# Patient Record
Sex: Female | Born: 1955 | Race: White | Hispanic: No | Marital: Married | Smoking: Never smoker
Health system: Southern US, Community
[De-identification: ages and names within clinical notes are randomized; demographics above are authoritative.]

## PROBLEM LIST (undated history)

## (undated) DIAGNOSIS — K219 Gastro-esophageal reflux disease without esophagitis: Secondary | ICD-10-CM

## (undated) DIAGNOSIS — M1990 Unspecified osteoarthritis, unspecified site: Secondary | ICD-10-CM

## (undated) DIAGNOSIS — I10 Essential (primary) hypertension: Secondary | ICD-10-CM

## (undated) DIAGNOSIS — E785 Hyperlipidemia, unspecified: Secondary | ICD-10-CM

## (undated) DIAGNOSIS — D329 Benign neoplasm of meninges, unspecified: Principal | ICD-10-CM

## (undated) HISTORY — DX: Benign neoplasm of meninges, unspecified: D32.9

## (undated) HISTORY — PX: OTHER SURGICAL HISTORY: SHX169

## (undated) HISTORY — DX: Essential (primary) hypertension: I10

---

## 1976-09-12 HISTORY — PX: WISDOM TOOTH EXTRACTION: SHX21

## 1990-09-12 HISTORY — PX: TUBAL LIGATION: SHX77

## 1991-09-13 HISTORY — PX: VAGINAL HYSTERECTOMY: SHX2639

## 2002-05-17 ENCOUNTER — Encounter: Payer: Self-pay | Admitting: Family Medicine

## 2002-05-17 ENCOUNTER — Encounter: Admission: RE | Admit: 2002-05-17 | Discharge: 2002-05-17 | Payer: Self-pay | Admitting: Family Medicine

## 2003-08-08 ENCOUNTER — Encounter: Admission: RE | Admit: 2003-08-08 | Discharge: 2003-08-08 | Payer: Self-pay | Admitting: Family Medicine

## 2004-10-15 ENCOUNTER — Encounter: Admission: RE | Admit: 2004-10-15 | Discharge: 2004-10-15 | Payer: Self-pay | Admitting: Family Medicine

## 2006-02-08 ENCOUNTER — Encounter: Admission: RE | Admit: 2006-02-08 | Discharge: 2006-02-08 | Payer: Self-pay | Admitting: Family Medicine

## 2007-08-22 ENCOUNTER — Encounter: Admission: RE | Admit: 2007-08-22 | Discharge: 2007-08-22 | Payer: Self-pay | Admitting: Family Medicine

## 2007-08-27 ENCOUNTER — Encounter: Admission: RE | Admit: 2007-08-27 | Discharge: 2007-08-27 | Payer: Self-pay | Admitting: Family Medicine

## 2008-10-03 ENCOUNTER — Ambulatory Visit (HOSPITAL_COMMUNITY): Admission: RE | Admit: 2008-10-03 | Discharge: 2008-10-03 | Payer: Self-pay | Admitting: Family Medicine

## 2008-10-06 LAB — HM DEXA SCAN

## 2009-02-16 ENCOUNTER — Ambulatory Visit: Payer: Self-pay | Admitting: Gastroenterology

## 2009-03-02 ENCOUNTER — Ambulatory Visit: Payer: Self-pay | Admitting: Gastroenterology

## 2009-03-02 LAB — HM COLONOSCOPY: HM Colonoscopy: NORMAL

## 2009-12-29 ENCOUNTER — Ambulatory Visit (HOSPITAL_COMMUNITY): Admission: RE | Admit: 2009-12-29 | Discharge: 2009-12-29 | Payer: Self-pay | Admitting: Family Medicine

## 2010-05-25 LAB — HM PAP SMEAR

## 2010-09-12 DIAGNOSIS — I10 Essential (primary) hypertension: Secondary | ICD-10-CM

## 2010-09-12 HISTORY — DX: Essential (primary) hypertension: I10

## 2011-02-08 ENCOUNTER — Other Ambulatory Visit (HOSPITAL_COMMUNITY): Payer: Self-pay | Admitting: Family Medicine

## 2011-02-08 DIAGNOSIS — Z1231 Encounter for screening mammogram for malignant neoplasm of breast: Secondary | ICD-10-CM

## 2011-02-24 ENCOUNTER — Ambulatory Visit (HOSPITAL_COMMUNITY)
Admission: RE | Admit: 2011-02-24 | Discharge: 2011-02-24 | Disposition: A | Discharge status: Home / Self Care | Payer: 59 | Source: Ambulatory Visit | Attending: Family Medicine | Admitting: Family Medicine

## 2011-02-24 DIAGNOSIS — Z1231 Encounter for screening mammogram for malignant neoplasm of breast: Secondary | ICD-10-CM | POA: Insufficient documentation

## 2011-04-14 ENCOUNTER — Ambulatory Visit (INDEPENDENT_AMBULATORY_CARE_PROVIDER_SITE_OTHER): Payer: 59 | Admitting: Sports Medicine

## 2011-04-14 VITALS — BP 154/97 | Ht 61.0 in | Wt 135.0 lb

## 2011-04-14 DIAGNOSIS — M79609 Pain in unspecified limb: Secondary | ICD-10-CM

## 2011-04-14 DIAGNOSIS — M2170 Unequal limb length (acquired), unspecified site: Secondary | ICD-10-CM

## 2011-04-14 DIAGNOSIS — M722 Plantar fascial fibromatosis: Secondary | ICD-10-CM | POA: Insufficient documentation

## 2011-04-14 DIAGNOSIS — M79673 Pain in unspecified foot: Secondary | ICD-10-CM | POA: Insufficient documentation

## 2011-04-14 NOTE — Assessment & Plan Note (Signed)
Given std exercises and stretches  We will also use an arch strap  She should ice after activities  I think it is okay for her to run but needs to keep the mileage to an easy level  Recheck in 6-8 weeks with a repeat scan

## 2011-04-14 NOTE — Assessment & Plan Note (Signed)
This is chronic but does not seem severe enough to warrant an injection  She will wear good shoes for her work at [employer]  When necessary use of OTC medications

## 2011-04-14 NOTE — Progress Notes (Signed)
  Subjective:    Patient ID: Ondria Oswald, female    DOB: 05-14-1956, 55 y.o.   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

## 2011-04-14 NOTE — Assessment & Plan Note (Signed)
I gave her sports insults and added 1 cm greater left on the short leg as well as heel pads bilaterally  She should use these were all running  Note that her running gait with the insoles looks good/  she does need to lower her arm swing

## 2012-03-21 ENCOUNTER — Other Ambulatory Visit (HOSPITAL_COMMUNITY): Payer: Self-pay | Admitting: Family Medicine

## 2012-03-21 DIAGNOSIS — Z1231 Encounter for screening mammogram for malignant neoplasm of breast: Secondary | ICD-10-CM

## 2012-04-16 ENCOUNTER — Ambulatory Visit (HOSPITAL_COMMUNITY)
Admission: RE | Admit: 2012-04-16 | Discharge: 2012-04-16 | Disposition: A | Discharge status: Home / Self Care | Payer: 59 | Source: Ambulatory Visit | Attending: Family Medicine | Admitting: Family Medicine

## 2012-04-16 DIAGNOSIS — Z1231 Encounter for screening mammogram for malignant neoplasm of breast: Secondary | ICD-10-CM | POA: Insufficient documentation

## 2012-06-28 ENCOUNTER — Ambulatory Visit (INDEPENDENT_AMBULATORY_CARE_PROVIDER_SITE_OTHER): Payer: 59 | Admitting: Sports Medicine

## 2012-06-28 VITALS — BP 138/80 | Ht 61.0 in | Wt 135.0 lb

## 2012-06-28 DIAGNOSIS — M2170 Unequal limb length (acquired), unspecified site: Secondary | ICD-10-CM

## 2012-06-28 DIAGNOSIS — M79673 Pain in unspecified foot: Secondary | ICD-10-CM

## 2012-06-28 DIAGNOSIS — M79609 Pain in unspecified limb: Secondary | ICD-10-CM

## 2012-06-28 DIAGNOSIS — M722 Plantar fascial fibromatosis: Secondary | ICD-10-CM

## 2012-06-28 DIAGNOSIS — IMO0002 Reserved for concepts with insufficient information to code with codable children: Secondary | ICD-10-CM

## 2012-06-28 DIAGNOSIS — S76319A Strain of muscle, fascia and tendon of the posterior muscle group at thigh level, unspecified thigh, initial encounter: Secondary | ICD-10-CM | POA: Insufficient documentation

## 2012-06-28 MED ORDER — MELOXICAM 15 MG PO TABS: ORAL_TABLET | ORAL | Status: DC

## 2012-06-28 NOTE — Progress Notes (Deleted)
  Subjective:    Patient ID: Stephanie Beard, female    DOB: 09/03/1956, 56 y.o.   MRN: [id number]  HPI     Review of Systems     Objective:   Physical Exam       Assessment & Plan:

## 2012-06-28 NOTE — Progress Notes (Signed)
  Subjective:    Patient ID: Stephanie Beard, female    DOB: 1956-01-29, 56 y.o.   MRN: [id number]  HPI chief complaint: Left hamstring pain  56 year old runner comes in today complaining of 3 weeks of left hamstring pain. Symptoms began while she was on a run. She had a similar injury in the spring which resolved without any specific medical intervention. She localizes all of her pain to the medial proximal hamstring. She's not noticed any swelling or bruising. Pain is worse with activity but also present some with sitting. She denies any low back pain. No numbness or tingling in the left leg. No groin pain. She was noted at her last office visit to have a 1.7 cm leg length discrepancy with the left leg shorter than the right. Dr. Darrick Penna gave her some inserts with a heel lift but she did not tolerate these well and stopped using them.    Review of Systems     Objective:   Physical Exam Well-developed, well-nourished. No acute distress. Awake alert and oriented x3   She has tenderness to palpation along the proximal medial hamstring mainly in the area of the semi-tendinosis proximal muscle belly. No tenderness at the ischial tuberosity. There is no palpable defect. There is no soft tissue swelling or ecchymosis. She has pain with resisted hamstring flexion both at 30 and 90. Smooth painless hip range of motion with a negative log roll. Negative straight leg. Neurovascular intact distally. Walking without a limp. She does have approximately 1.5 cm shortening of the left leg with sitting lying supine.  MSK ultrasound of the left hamstring shows a small hypoechoic area in the area of the proximal semi-tendinosis. This is seen both with longitudinal and transverse imaging. I do not see any increase in neovascularity. Muscle fibers appear to be intact so I think this represents a simple strain.       Assessment & Plan:  1. Mild left hamstring strain 2. Leg length discrepancy  Mobic 15 mg daily  for 7 days then when necessary. Body helix is compression sleeve with activity. I recommended that she walk for exercise on level ground. I've given her a 5/16 inch lift for the left leg on sports insoles and she will start phase I of hamstring rehabilitation. I do not want to completely correct her leg length discrepancy and she has not tolerated this in the past but I do think that this is contributing to her injury. Ice after exercise and followup in 3 weeks for recheck on her progress.

## 2012-07-19 ENCOUNTER — Ambulatory Visit (INDEPENDENT_AMBULATORY_CARE_PROVIDER_SITE_OTHER): Payer: 59 | Admitting: Sports Medicine

## 2012-07-19 VITALS — BP 120/70 | Ht 61.0 in | Wt 135.0 lb

## 2012-07-19 DIAGNOSIS — IMO0002 Reserved for concepts with insufficient information to code with codable children: Secondary | ICD-10-CM

## 2012-07-19 DIAGNOSIS — S76319A Strain of muscle, fascia and tendon of the posterior muscle group at thigh level, unspecified thigh, initial encounter: Secondary | ICD-10-CM

## 2012-07-19 NOTE — Progress Notes (Signed)
Patient ID: Ronell Boldin, female   DOB: 05-21-1956, 56 y.o.   MRN: [id number]  SUBJECTIVE: Sephira Zellman is a 56 y.o. female with PMHx HTN and unremarkable orthopedic history presenting for follow-up of LEFT hamstring strain and LEFT leg length discrepancy.  She has been performing phase 1 of eccentric strengthening exercises 5 days/week since last visit and wearing orthotics whenever walking.  She has not returned to running but is currently walking 1 mile 2-3 days per week and 2-3 miles on Saturday.  She wears a body helix sleeve whenever she is walking or active with good support.  She is able to walk without any pain or discomfort.    Denies ever having pain but reports some discomfort/tightness with prolonged sitting that resolves with stretching / activity / getting up and walking around.  Finished 1 week course of mobic without issue; occasional NSAID use every 3-4 days since then.  No GI upset or discomfort from either.  PMHx: HTN  PSHx: Hysterectomy  Meds: HCTZ Estradiol  Allergies: NKFA NKDA   OBJECTIVE: Vital signs as noted above. Appearance - well appearing female, in no acute distress; resting comfortably  Knee exam: - No gross abnormalities on inspection - AROM is symmetric and appropriate for knee flexion, extension  - AROM symmetric, appropriate for hip flexion, extension, IR, ER, all of the above pain-free - 5/5 strength in seated knee extension, flexion - 5/5 strength in seated hip flexion, IR, ER - 5/5 strength in SUPINE hip extension; 5/5 strength in SUPINE knee flexion at 90 degrees and 30 degrees of knee flexion; no pain with either maneuver - No tenderness to palpation or appreciable defects along all 3 hamstring tendons and muscle bellies both at rest and during knee flexion - No tenderness to palpation of ischial tuberosity when supine or fibular head, pes anserine when seated bilaterally   ASSESSMENT: 1. Left hamstring strain, healing 2. Leg length  discrepancy  PLAN: Pt with historical features to suggest healing hamstring strain, confirmed with functional testing of hamstring in multiple conditions.  She will progress to phase II of hamstring stretching / eccentric strengthening rehabilitation protocol.  She will avoid stretching before running / jogging / walking -- she may progress to slow jog and then gradually return to running as guided by pain.  She will wear body helix sleeve over hamstring when active for the time-being, especially during re-introduction to running.    For leg length, she continues to enjoy 5/16 inch heel lift on LEFT leg in her running shoes.  Although this is an incomplete correction, she is pain-free at this size of correction and will continue to use this in her running shoes.  She was not interested in continuing correction or increasing size of lift at this time. She will follow-up as needed but otherwise no scheduled follow-up.  -- Kenney Houseman, MS IV

## 2012-10-27 ENCOUNTER — Other Ambulatory Visit: Payer: Self-pay

## 2012-11-14 ENCOUNTER — Encounter: Payer: Self-pay | Admitting: Sports Medicine

## 2013-02-22 ENCOUNTER — Other Ambulatory Visit (HOSPITAL_COMMUNITY): Payer: Self-pay | Admitting: Family Medicine

## 2013-02-22 DIAGNOSIS — H532 Diplopia: Secondary | ICD-10-CM

## 2013-03-06 ENCOUNTER — Other Ambulatory Visit (HOSPITAL_COMMUNITY): Payer: 59

## 2013-03-18 ENCOUNTER — Ambulatory Visit (HOSPITAL_COMMUNITY): Payer: 59

## 2013-03-18 ENCOUNTER — Ambulatory Visit (HOSPITAL_COMMUNITY): Admission: RE | Admit: 2013-03-18 | Payer: 59 | Source: Ambulatory Visit

## 2013-03-19 ENCOUNTER — Ambulatory Visit (HOSPITAL_COMMUNITY)
Admission: RE | Admit: 2013-03-19 | Discharge: 2013-03-19 | Disposition: A | Discharge status: Home / Self Care | Payer: 59 | Source: Ambulatory Visit | Attending: Family Medicine | Admitting: Family Medicine

## 2013-03-19 DIAGNOSIS — I6789 Other cerebrovascular disease: Secondary | ICD-10-CM | POA: Insufficient documentation

## 2013-03-19 DIAGNOSIS — H532 Diplopia: Secondary | ICD-10-CM | POA: Insufficient documentation

## 2013-03-19 DIAGNOSIS — H538 Other visual disturbances: Secondary | ICD-10-CM | POA: Insufficient documentation

## 2013-03-19 DIAGNOSIS — G939 Disorder of brain, unspecified: Secondary | ICD-10-CM | POA: Insufficient documentation

## 2013-04-03 ENCOUNTER — Other Ambulatory Visit: Payer: Self-pay | Admitting: Neurosurgery

## 2013-04-03 DIAGNOSIS — H532 Diplopia: Secondary | ICD-10-CM

## 2013-04-09 ENCOUNTER — Other Ambulatory Visit: Payer: Self-pay | Admitting: Neurosurgery

## 2013-04-09 LAB — CREATININE, SERUM: Creat: 0.7 mg/dL (ref 0.50–1.10)

## 2013-04-10 ENCOUNTER — Ambulatory Visit
Admission: RE | Admit: 2013-04-10 | Discharge: 2013-04-10 | Disposition: A | Discharge status: Home / Self Care | Payer: 59 | Source: Ambulatory Visit | Attending: Neurosurgery | Admitting: Neurosurgery

## 2013-04-10 DIAGNOSIS — H532 Diplopia: Secondary | ICD-10-CM

## 2013-04-10 MED ORDER — GADOBENATE DIMEGLUMINE 529 MG/ML IV SOLN
12.0000 mL | Freq: Once | INTRAVENOUS | Status: AC | PRN
  Administered 2013-04-10: 12 mL via INTRAVENOUS

## 2013-04-12 DIAGNOSIS — D329 Benign neoplasm of meninges, unspecified: Secondary | ICD-10-CM

## 2013-04-12 HISTORY — DX: Benign neoplasm of meninges, unspecified: D32.9

## 2013-04-16 ENCOUNTER — Encounter: Payer: Self-pay | Admitting: Radiation Oncology

## 2013-04-16 DIAGNOSIS — D329 Benign neoplasm of meninges, unspecified: Secondary | ICD-10-CM

## 2013-04-16 NOTE — Progress Notes (Signed)
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 female who noted diplopia 2 months ago.  Accordingly, brain MRI was obtained on 03/19/2013. This revealed a 2.6 x 1.9 x 1.6 cm sellar and suprasellar mass lesion which extends  into the right cavernous sinus and into the suprasellar cistern.  The mass extends into the right orbital apex and could certainly have mass effect on the third, fourth, fifth, or sixth right cranial nerves. This could also of mass effect on the right optic nerve.  Initially, the differential diagnosis included pituitary tumor versus meningioma. The patient was seen by Dr. Newell Coral in neurosurgery.  He obtained a thin slice 3T MRI for better delineation of the tumor.  The additional study was more diagnostic for meningioma essentially ruling out a pituitary primary.  She has been referred today for discussion of treatment options.  PREVIOUS RADIATION THERAPY: No  PAST MEDICAL HISTORY:  has a past medical history of Hypertension and Meningioma.    PAST SURGICAL HISTORY: Past Surgical History  Procedure Laterality Date  . Bladder tack  1993  . Wisdom tooth extraction  1978  . Abdominal hysterectomy  1993    partial  . Tubal ligation  1992    FAMILY HISTORY: family history includes Heart disease in her father and mother; Hypercholesterolemia in her mother; and Hypertension in her father and mother.  SOCIAL HISTORY:  reports that she has never smoked. She has never used smokeless tobacco. She reports that  drinks alcohol. She reports that she does not use illicit drugs.  ALLERGIES: Review of patient's allergies  indicates no known allergies.  MEDICATIONS:  Current Outpatient Prescriptions  Medication Sig Dispense Refill  . aspirin 81 MG tablet Take 81 mg by mouth daily.      . calcium-vitamin D 250-100 MG-UNIT per tablet Take 1 tablet by mouth 2 (two) times daily.      Marland Kitchen estradiol (ESTRACE) 0.5 MG tablet Take 0.5 mg by mouth daily.      . hydrochlorothiazide (HYDRODIURIL) 25 MG tablet Take 25 mg by mouth daily.      . Multiple Vitamin (MULTIVITAMIN) tablet Take 1 tablet by mouth daily.      Marland Kitchen LORazepam (ATIVAN) 1 MG tablet Take 1 tablet (1 mg total) by mouth every 8 (eight) hours.  30 tablet  0   No current facility-administered medications for this encounter.    REVIEW OF SYSTEMS:  A 15 point review of systems is documented in the electronic medical record. This was obtained by the nursing staff. However, I reviewed this with the patient to discuss relevant findings and make appropriate changes.  Pertinent items are noted in HPI.   PHYSICAL EXAM:  height is 5\' 1"  (1.549 m) and weight is 139 lb 11.2 oz (63.368 kg). Her oral temperature is 98 F (36.7 C). Her blood pressure is 138/88 and her pulse is 54. Her respiration is 16 and oxygen saturation is 100%.   The patient is in no acute distress today. She is alert and oriented. Her speech is fluent articulate. Motor strength is intact throughout. Gait is unremarkable. Extraocular muscles are intact with perhaps some minimal right cranial nerve 6 dysfunction.,  She does have mild diplopia on the right gaze.  KPS = 100  LABORATORY DATA:  No results found for this basename: WBC,  HGB,  HCT,  MCV,  PLT   No results found for this basename: NA,  K,  CL,  CO2   No results found for this basename: ALT,  AST,  GGT,  ALKPHOS,  BILITOT     RADIOGRAPHY: Mr Shirlee Latch Wo Contrast  03/20/2013   **ADDENDUM** CREATED: 03/20/2013 10:52:17  These results were called by telephone on 03/20/2013 at 09:45 a.m. to Dr. Zachery Dauer, who verbally acknowledged these results.   **END ADDENDUM** SIGNED BY: Chauncey Fischer, M.D.  03/20/2013   *RADIOLOGY REPORT*  Clinical Data:  New onset of diplopia.  Blurred vision when looking to the right.  MRI HEAD WITHOUT CONTRAST MRA HEAD WITHOUT CONTRAST  Technique:  Multiplanar, multiecho pulse sequences of the brain and surrounding structures were obtained without intravenous contrast. Angiographic images of the head were obtained using MRA technique without contrast.  Comparison:   None  MRI HEAD  Findings:  A sellar and suprasellar mass is evident on the right. There is invasion into the right cavernous sinus.  This appears contiguous with the pituitary.  The pituitary gland is enlarged, measuring 60 mm cephalocaudad extending to the level of the optic chiasm.  Lesion is homogeneous, of intermediate intensity on both T1 and T2-weighted imaging.  This surrounds the right internal carotid artery without narrowing the vessel.  There does appear to be invasion into the right orbital apex with medial deviation of the optic nerve.  The left cavernous sinus and orbital apex are normal.  Mild periventricular and scattered subcortical T2 hyperintensities are present bilaterally, somewhat advanced for age.  There is no evidence for acute infarct or hemorrhage.  Flow is present in the major intracranial arteries.  The paranasal sinuses and mastoid air cells are clear.  IMPRESSION:  1.  2.6 x 1.9 x 1.6 cm sellar and suprasellar mass lesion extends into the right cavernous sinus and into the suprasellar cistern. The primary differential diagnosis includes a pituitary adenoma or more likely a meningioma.  Metastatic disease is considered less likely. 2.  The mass extends into the right orbital apex and could certainly have mass effect on the third, fourth, fifth, or sixth right cranial nerves.  This could also of mass effect on the right optic nerve.  Recommend follow-up MRI of the brain with contrast.  Dedicated thin cut imaging through the sella may be  of additional use. 2.  Mild periventricular scattered subcortical T2 hyperintensities. The finding is nonspecific but can be seen in the setting of chronic microvascular ischemia, a demyelinating process such as multiple sclerosis, vasculitis, complicated migraine headaches, or as the sequelae of a prior infectious or inflammatory process.  MRA HEAD  Findings: The internal carotid arteries are within normal limits from high cervical segments through the ICA termini bilaterally. The A1 and M1 segments are normal.  The anterior communicating artery is patent.  The MCA bifurcations are within normal limits. There is slight attenuation of distal MCA branch vessels bilaterally.  The left vertebral artery is slightly dominant to the right.  Left PICA origin is visualized.  The right PICA is small.  AICA vessels are seen bilaterally.  The basilar artery is within normal limits. The posterior cerebral arteries both originate from basilar tip. There is mild attenuation of distal PCA branch vessels.  IMPRESSION:  1.  Mild distal small vessel disease. 2.  No  significant proximal stenosis, aneurysm, or branch vessel occlusion.  Original Report Authenticated By: Marin Roberts, M.D.   Mr Brain Wo Contrast  03/20/2013   **ADDENDUM** CREATED: 03/20/2013 10:52:17  These results were called by telephone on 03/20/2013 at 09:45 a.m. to Dr. Zachery Dauer, who verbally acknowledged these results.  **END ADDENDUM** SIGNED BY: Chauncey Fischer, M.D.  03/20/2013   *RADIOLOGY REPORT*  Clinical Data:  New onset of diplopia.  Blurred vision when looking to the right.  MRI HEAD WITHOUT CONTRAST MRA HEAD WITHOUT CONTRAST  Technique:  Multiplanar, multiecho pulse sequences of the brain and surrounding structures were obtained without intravenous contrast. Angiographic images of the head were obtained using MRA technique without contrast.  Comparison:   None  MRI HEAD  Findings:  A sellar and suprasellar mass is evident on the right. There is  invasion into the right cavernous sinus.  This appears contiguous with the pituitary.  The pituitary gland is enlarged, measuring 60 mm cephalocaudad extending to the level of the optic chiasm.  Lesion is homogeneous, of intermediate intensity on both T1 and T2-weighted imaging.  This surrounds the right internal carotid artery without narrowing the vessel.  There does appear to be invasion into the right orbital apex with medial deviation of the optic nerve.  The left cavernous sinus and orbital apex are normal.  Mild periventricular and scattered subcortical T2 hyperintensities are present bilaterally, somewhat advanced for age.  There is no evidence for acute infarct or hemorrhage.  Flow is present in the major intracranial arteries.  The paranasal sinuses and mastoid air cells are clear.  IMPRESSION:  1.  2.6 x 1.9 x 1.6 cm sellar and suprasellar mass lesion extends into the right cavernous sinus and into the suprasellar cistern. The primary differential diagnosis includes a pituitary adenoma or more likely a meningioma.  Metastatic disease is considered less likely. 2.  The mass extends into the right orbital apex and could certainly have mass effect on the third, fourth, fifth, or sixth right cranial nerves.  This could also of mass effect on the right optic nerve.  Recommend follow-up MRI of the brain with contrast.  Dedicated thin cut imaging through the sella may be of additional use. 2.  Mild periventricular scattered subcortical T2 hyperintensities. The finding is nonspecific but can be seen in the setting of chronic microvascular ischemia, a demyelinating process such as multiple sclerosis, vasculitis, complicated migraine headaches, or as the sequelae of a prior infectious or inflammatory process.  MRA HEAD  Findings: The internal carotid arteries are within normal limits from high cervical segments through the ICA termini bilaterally. The A1 and M1 segments are normal.  The anterior communicating  artery is patent.  The MCA bifurcations are within normal limits. There is slight attenuation of distal MCA branch vessels bilaterally.  The left vertebral artery is slightly dominant to the right.  Left PICA origin is visualized.  The right PICA is small.  AICA vessels are seen bilaterally.  The basilar artery is within normal limits. The posterior cerebral arteries both originate from basilar tip. There is mild attenuation of distal PCA branch vessels.  IMPRESSION:  1.  Mild distal small vessel disease. 2.  No significant proximal stenosis, aneurysm, or branch vessel occlusion.  Original Report Authenticated By: Marin Roberts, M.D.   Mr Laqueta Jean WU Contrast  04/10/2013   *RADIOLOGY REPORT*  Clinical Data: Right suprasellar mass lesion.  MRI HEAD WITHOUT AND WITH CONTRAST  Technique:  Multiplanar, multiecho pulse sequences of the  brain and surrounding structures were obtained according to standard protocol without and with intravenous contrast  Contrast: 12mL MULTIHANCE GADOBENATE DIMEGLUMINE 529 MG/ML IV SOLN  Comparison: MRI without contrast 03/19/2013  Findings: The previously noted right suprasellar mass lesion extends along the dura both anteriorly into the middle cranial fossa and posteriorly along the incisura, compatible with a meningioma.  There is invasion into the right cavernous sinus on the right side of the sella.  The right internal carotid artery is patent.  The tumor does extend into the right orbital apex, as suspected.  It uplifts the right side of the optic chiasm and surrounds the pre-chiasmatic right optic nerve.  The left cavernous sinus is within normal limits.  The mass is not clearly separated from the pituitary.  The lesion uplifts the supraclinoid right ICA.  The postcontrast images demonstrate no additional areas of pathologic enhancement.  No acute infarct or hemorrhage is present.  Flow is still present in the major intracranial arteries.  The globes and orbits are otherwise  intact.  IMPRESSION:  1.  Enhancing right parasellar dural based mass lesion measures 2.0 x 1.9 x 3.3 cm, most compatible with a meningioma.  A pituitary neoplasm is now considered much less likely. 2.  The lesion extends through the right cavernous sinus into the right lateral aspect of the sella creating mass effect as described above. 3.  No other pathologic areas of enhancement or evidence for metastatic disease is present.   Original Report Authenticated By: Marin Roberts, M.D.      IMPRESSION: This patient is a very nice high functioning 57 year old woman with a right-sided sphenoid wing meningioma. Her meningioma is encasing the right optic nerve and displacing the optic chiasm superiorly. This proximity of the optic pathway would likely prevent the usage of ablative radiation doses such as single fraction stereotactic radiosurgery or even hypo-fractionated stereotactic radiotherapy. In this setting, the maximum likelihood for vision preservation would be associated with conventionally fractionated stereotactic radiotherapy. I have talked about her case with referring physician as well as a radiosurgery team at a nearby academic institution after the reviewed her MRI with me. There was uniform consensus that complete surgical resection would not be feasible without grave morbidity. There was some discussion about partial resection followed by radiation, but the patient's largely asymptomatic presentation and lack of intracranial tumor extension raised some question as to the value of "debulking."  I have not gotten a consensus resolution on whether there would be any value to biopsy for confirmation of histology, and plan to pursue this question further.  PLAN: Today, I shared with the patient the discussion described above and we reviewed her MRI together. I described to her the rationale for avoiding ablative radiation close to the optic pathway. We talked about the potential surgical options. We  talked about the potential radiation options. At this point, I plan to present her case in a multidisciplinary fashion to our neuro-oncology tumor Board in conjunction with the [facility] on August 20. I've also requested a baseline endocrinologic evaluation prior to treatment. Depending on the results of the multidisciplinary discussion, we may elect to proceed with conventionally fractionated stereotactic radiotherapy on August 27 delivering between 50.4 and 54 Gy at 1.8 Gy per fraction.  I spent 60 minutes minutes face to face with the patient and more than 50% of that time was spent in counseling and/or coordination of care.  I wrote out notes during today's discussion outlining our discussion including some treatment diagrams.  These were provided to the patient and a copy was maintained for our records.   ------------------------------------------------  Artist Pais Kathrynn Running, M.D.

## 2013-04-16 NOTE — Progress Notes (Signed)
57 year old. Married. Media planner for [employer].  Enhancing right parasellar dural based mass lesion measures 2.0 x 1.9 x 3.3 cm, most compatible with a meningioma. A pituitary neoplasm is now considered much less likely. The lesion extends through the right cavernous sinus into the  right lateral aspect of the sella creating mass effect as described above. 3. No other pathologic areas of enhancement or evidence for metastatic disease is present.  Reports 6-8 weeks ago she developed horizontal diplopia and right lateral gaze. Denies headache, nausea, vomiting, facial pain or facial numbness.   Referred by Dr. Newell Coral to Dr. Kathrynn Running to discuss [facility] treatment in the treatment of meningioma.  NKDA No hx of radiation therapy Denies having a pacemaker

## 2013-04-17 ENCOUNTER — Ambulatory Visit
Admission: RE | Admit: 2013-04-17 | Discharge: 2013-04-17 | Disposition: A | Discharge status: Home / Self Care | Payer: 59 | Source: Ambulatory Visit | Attending: Radiation Oncology | Admitting: Radiation Oncology

## 2013-04-17 ENCOUNTER — Encounter: Payer: Self-pay | Admitting: Radiation Oncology

## 2013-04-17 VITALS — BP 138/88 | HR 54 | Temp 98.0°F | Resp 16 | Ht 61.0 in | Wt 139.7 lb

## 2013-04-17 DIAGNOSIS — D320 Benign neoplasm of cerebral meninges: Secondary | ICD-10-CM | POA: Insufficient documentation

## 2013-04-17 DIAGNOSIS — D329 Benign neoplasm of meninges, unspecified: Secondary | ICD-10-CM

## 2013-04-17 MED ORDER — LORAZEPAM 1 MG PO TABS: 1.0000 mg | ORAL_TABLET | Freq: Three times a day (TID) | ORAL | Status: DC

## 2013-04-17 NOTE — Progress Notes (Signed)
Reports chronic floaters in both eyes. Reports right eye diplopia only when looking right out of her peripheral vision. Denies headaches. Reports night sweats related to menopause. Denies nausea, vomiting, dizziness or diarrhea. Reports she is an avid runner.

## 2013-04-17 NOTE — Progress Notes (Signed)
Complete PATIENT MEASURE OF DISTRESS worksheet with a score of 1 submitted to social work. 

## 2013-04-17 NOTE — Progress Notes (Signed)
See progress note under physician encounter. 

## 2013-04-18 ENCOUNTER — Encounter: Payer: Self-pay | Admitting: Radiation Oncology

## 2013-05-01 ENCOUNTER — Encounter (HOSPITAL_COMMUNITY): Payer: Self-pay | Admitting: Emergency Medicine

## 2013-05-01 ENCOUNTER — Emergency Department (HOSPITAL_COMMUNITY)
Admission: EM | Admit: 2013-05-01 | Discharge: 2013-05-01 | Disposition: A | Discharge status: Home / Self Care | Payer: 59 | Attending: Emergency Medicine | Admitting: Emergency Medicine

## 2013-05-01 ENCOUNTER — Emergency Department (HOSPITAL_COMMUNITY): Payer: 59

## 2013-05-01 DIAGNOSIS — Z7982 Long term (current) use of aspirin: Secondary | ICD-10-CM | POA: Insufficient documentation

## 2013-05-01 DIAGNOSIS — M25569 Pain in unspecified knee: Secondary | ICD-10-CM | POA: Insufficient documentation

## 2013-05-01 DIAGNOSIS — Z85841 Personal history of malignant neoplasm of brain: Secondary | ICD-10-CM | POA: Insufficient documentation

## 2013-05-01 DIAGNOSIS — M25561 Pain in right knee: Secondary | ICD-10-CM

## 2013-05-01 DIAGNOSIS — I10 Essential (primary) hypertension: Secondary | ICD-10-CM | POA: Insufficient documentation

## 2013-05-01 DIAGNOSIS — Z79899 Other long term (current) drug therapy: Secondary | ICD-10-CM | POA: Insufficient documentation

## 2013-05-01 NOTE — ED Provider Notes (Signed)
CSN: [id number]     Arrival date & time 05/01/13  0907 History     First MD Initiated Contact with Patient 05/01/13 [phone]     Chief Complaint  Patient presents with  . Knee Pain   (Consider location/radiation/quality/duration/timing/severity/associated sxs/prior Treatment) HPI Stephanie Beard is a 57 y.o.female without any significant PMH presents to the ER with complaints of knee pain. She versed injured it while running a couple of days and has been wearing a knee brace since then. 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 He did not called her to fall, hit her head, or injury to any other part of body. She describes being unable to walk on her knee. It does not hurt during rest but hurts significantly with any movement. The pain is most intense behind her knee. She reports having frequent hamstring injuries in the past.  Past Medical History  Diagnosis Date  . Hypertension   . Meningioma    Past Surgical History  Procedure Laterality Date  . Bladder tack  1993  . Wisdom tooth extraction  1978  . Abdominal hysterectomy  1993    partial  . Tubal ligation  1992   Family History  Problem Relation Age of Onset  . Hypertension Mother   . Hypercholesterolemia Mother   . Heart disease Mother   . Heart disease Father   . Hypertension Father    History  Substance Use Topics  . Smoking status: Never Smoker   . Smokeless tobacco: Never Used  . Alcohol Use: Yes     Comment: socially   OB History   Grav Para Term Preterm Abortions TAB SAB Ect Mult Living                 Review of Systems ROS is negative unless otherwise stated in the HPI  Allergies  Review of patient's allergies indicates no known allergies.  Home Medications   Current Outpatient Rx  Name  Route  Sig  Dispense  Refill  . aspirin EC 81 MG tablet   Oral   Take 81 mg by mouth daily.         . calcium-vitamin D 250-100 MG-UNIT per tablet   Oral   Take 1 tablet  by mouth daily.          Marland Kitchen estradiol (ESTRACE) 0.5 MG tablet   Oral   Take 0.5 mg by mouth at bedtime.          . hydrochlorothiazide (HYDRODIURIL) 25 MG tablet   Oral   Take 25 mg by mouth every morning.          . Multiple Vitamin (MULTIVITAMIN WITH MINERALS) TABS tablet   Oral   Take 1 tablet by mouth daily.         . naproxen sodium (ANAPROX) 220 MG tablet   Oral   Take 220 mg by mouth 2 (two) times daily as needed (for pain).          BP 140/85  Pulse 55  Temp(Src) 98.7 F (37.1 C) (Oral)  Resp 20  SpO2 98% Physical Exam  Nursing note and vitals reviewed. Constitutional: She appears well-developed and well-nourished. No distress.  HENT:  Head: Normocephalic and atraumatic.   HEENT: Anicteric.  No pallor.  No discharge from ears, eyes, nose, or mouth.   Eyes: Pupils are equal, round, and reactive to light.  Neck: Normal range of motion. Neck supple.  Cardiovascular: Normal rate and  regular rhythm.   Pulmonary/Chest: Effort normal.  Abdominal: Soft.  Musculoskeletal:       Right knee: She exhibits swelling. She exhibits normal range of motion, no effusion, no ecchymosis, no deformity, no laceration, no erythema, normal alignment, no LCL laxity and normal patellar mobility. Tenderness (posterior knee) found.       Legs: Neurological: She is alert.  Skin: Skin is warm and dry.    ED Course   Procedures (including critical care time)  Labs Reviewed - No data to display Dg Knee Complete 4 Views Right  05/01/2013   CLINICAL DATA:  57 year old female with knee pain. Unable to bear weight.  EXAM: RIGHT KNEE - COMPLETE 4+ VIEW  COMPARISON:  None.  FINDINGS: Joint spaces appear preserved. No definite joint effusion. Patella intact. Benign appearing sclerotic focus in the proximal tibia. Otherwise Bone mineralization is within normal limits. No acute fracture identified.  IMPRESSION: No acute fracture or dislocation identified about the right knee.    Electronically Signed   By: Augusto Gamble   On: 05/01/2013 10:42   1. Knee pain, acute, right     MDM  Normal xrays, pt has significant pain with movement. Has orthopedist doctor already. Will refer her back to that practice. She denied wanting pain medication here in the ED or a prescription. Will give knee immobilizer and crutches.   68 y.o.Stephanie Beard's evaluation in the Emergency Department is complete. It has been determined that no acute conditions requiring further emergency intervention are present at this time. The patient/guardian have been advised of the diagnosis and plan. We have discussed signs and symptoms that warrant return to the ED, such as changes or worsening in symptoms.  Vital signs are stable at discharge. Filed Vitals:   05/01/13 1128  BP: 140/85  Pulse: 55  Temp: 98.7 F (37.1 C)  Resp: 20    Patient/guardian has voiced understanding and agreed to follow-up with the PCP or specialist.   Dorthula Matas, PA-C 05/01/13 1133

## 2013-05-01 NOTE — ED Provider Notes (Signed)
Medical screening examination/treatment/procedure(s) were performed by non-physician practitioner and as supervising physician I was immediately available for consultation/collaboration.  Joal Eakle M Ramari Bray, MD 05/01/13 1152 

## 2013-05-01 NOTE — ED Notes (Signed)
Pt is a [employer] employee.  States that she was walking to the mail room and "something in her knee popped".  Denies falling or injuring it in any way.

## 2013-05-02 ENCOUNTER — Other Ambulatory Visit: Payer: Self-pay | Admitting: Radiation Oncology

## 2013-05-03 ENCOUNTER — Ambulatory Visit
Admission: RE | Admit: 2013-05-03 | Discharge: 2013-05-03 | Disposition: A | Discharge status: Home / Self Care | Payer: 59 | Source: Ambulatory Visit | Attending: Radiation Oncology | Admitting: Radiation Oncology

## 2013-05-03 DIAGNOSIS — D329 Benign neoplasm of meninges, unspecified: Secondary | ICD-10-CM

## 2013-05-03 DIAGNOSIS — Z510 Encounter for antineoplastic radiation therapy: Secondary | ICD-10-CM | POA: Insufficient documentation

## 2013-05-03 DIAGNOSIS — D320 Benign neoplasm of cerebral meninges: Secondary | ICD-10-CM | POA: Insufficient documentation

## 2013-05-03 DIAGNOSIS — R51 Headache: Secondary | ICD-10-CM | POA: Insufficient documentation

## 2013-05-03 MED ORDER — SODIUM CHLORIDE 0.9 % IJ SOLN
10.0000 mL | Freq: Once | INTRAMUSCULAR | Status: AC
  Administered 2013-05-03: 10 mL via INTRAVENOUS

## 2013-05-03 NOTE — Progress Notes (Signed)
  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  NARRATIVE:  The patient was brought to the CT Simulation planning suite.  Identity was confirmed.  All relevant records and images related to the planned course of therapy were reviewed.  The patient freely provided informed written consent to proceed with treatment after reviewing the details related to the planned course of therapy. The consent form was witnessed and verified by the simulation staff. Intravenous access was established for contrast administration. Then, the patient was set-up in a stable reproducible supine position for radiation therapy.  A relocatable thermoplastic stereotactic head frame was fabricated for precise immobilization.  CT images were obtained.  Surface markings were placed.  The CT images were loaded into the planning software and fused with the patient's targeting MRI scan.  Then the target and avoidance structures were contoured.  Treatment planning then occurred.  The radiation prescription was entered and confirmed.  I have requested 3D planning  I have requested a DVH of the following structures: Brain stem, brain, left eye, right I, lenses, optic chiasm, target volumes, uninvolved brain, and normal tissue.    PLAN:  The patient will receive 52.2 Gy in 29 fractions.  ________________________________  Artist Pais Kathrynn Running, M.D.

## 2013-05-03 NOTE — Progress Notes (Signed)
Patient gave name and dob as identification, lab CR=0.70, not allergic to IV dye, not a diabetic, started @22g  1 inch IV catheter LAC x 1 attempt, excellent blood return, taped opsite over  And secured with tape, 2x2 gause over opsite, patuient tolerated well, patient instructed to take her Ativan sl at 0745am, verbal understanding, and patient aware IV will be d/c in Ct sim, notified Arlys John CT therapist patient is ready 7:58 AM

## 2013-05-03 NOTE — Progress Notes (Signed)
Removed IV site LAC from patient secured  With 2x2 gause and taped, instructed to keep arm elevated 1-2 minutes, keep dressing  On till this afternoon, when removing if area starts to bleed again, place dressing and apply pressure again, and leave on till the morning, verbal understanding, patient tolerated well, no c/o pain , 9:14 AM

## 2013-05-08 ENCOUNTER — Ambulatory Visit: Payer: 59 | Admitting: Radiation Oncology

## 2013-05-08 ENCOUNTER — Ambulatory Visit: Admission: RE | Admit: 2013-05-08 | Payer: 59 | Source: Ambulatory Visit | Admitting: Radiation Oncology

## 2013-05-08 ENCOUNTER — Encounter: Payer: Self-pay | Admitting: Sports Medicine

## 2013-05-08 ENCOUNTER — Ambulatory Visit (INDEPENDENT_AMBULATORY_CARE_PROVIDER_SITE_OTHER): Payer: 59 | Admitting: Sports Medicine

## 2013-05-08 VITALS — BP 160/97 | HR 60 | Ht 61.0 in | Wt 139.0 lb

## 2013-05-08 DIAGNOSIS — S86111A Strain of other muscle(s) and tendon(s) of posterior muscle group at lower leg level, right leg, initial encounter: Secondary | ICD-10-CM

## 2013-05-08 DIAGNOSIS — M25561 Pain in right knee: Secondary | ICD-10-CM

## 2013-05-08 DIAGNOSIS — S838X9A Sprain of other specified parts of unspecified knee, initial encounter: Secondary | ICD-10-CM

## 2013-05-08 DIAGNOSIS — M25569 Pain in unspecified knee: Secondary | ICD-10-CM

## 2013-05-08 NOTE — Patient Instructions (Signed)
We think that you tore the tendon sheath of your gastroc as the hamstring and gastroc cross  Wear knee compression sleeve when you are on your feet Wear heel lift in shoes Do eccentric calf raises 3 sets of 15-30 per day Okay to do light running as long as you are not limping and your pain is < 3/10.

## 2013-05-08 NOTE — Progress Notes (Signed)
CC: Right posterior knee pain HPI: Stephanie Beard is a very pleasant 57 year old female who presents with right posterior knee pain. She states that she initially felt some cramping type of pain in the back of her right leg behind the knee about a week ago while she was running. She took a week off from running to try to let this heal. Then one week ago she was walking at work and felt a sudden pop in the posterior aspect of her knee. She had immediate pain and could not bear weight or walk. She denies any bruising. She was seen in the ER where she had x-rays that were negative and she was placed in a knee immobilizer. She denies any swelling. She is not currently taking any medications.  ROS: As above in the HPI. All other systems are stable or negative.  OBJECTIVE: APPEARANCE:  Patient in no acute distress.The patient appeared well nourished and normally developed. HEENT: No scleral icterus. Conjunctiva non-injected Resp: Non labored Skin: No rash MSK:  Right Knee - Inspection normal with no erythema or effusion or obvious bony abnormalities.  - Palpation normal with no warmth or joint line tenderness. No TTP at patellar or quad tendon. No tenderness to palpation in the popliteal fossa. - ROM normal in flexion and extension. - Strength 5/5 in flexion and extension. Patient has no pain on hamstring testing with varying degrees of knee flexion or hip extension - Ligaments with solid consistent endpoints including ACL, PCL, LCL, MCL.  - Negative Mcmurray's.  - Non painful patellar compression.  - Neurovascularly intact  MSK [place]: Ultrasound of the posterior right knee in the popliteal fossa was performed in transverse and longitudinal views. There are 2 large hypoechoic fluid collections in the medial popliteal fossa around the gastroc and hamstring tendons. These do not eliminate with Doppler. They are visualized in both transverse and longitudinal views.  ASSESSMENT: Right gastrocnemius tendon  sheath tear   PLAN: Will place patient in a body helix knee compression sleeve. She was given a heel lift to wear in her right shoe to reduce the stretch on the gastroc. She was given a home exercise protocol with he sent her calf raises to work on. We will see her back in one month for repeat ultrasound and reevaluation.

## 2013-05-09 ENCOUNTER — Ambulatory Visit
Admission: RE | Admit: 2013-05-09 | Discharge: 2013-05-09 | Disposition: A | Discharge status: Home / Self Care | Payer: 59 | Source: Ambulatory Visit | Attending: Radiation Oncology | Admitting: Radiation Oncology

## 2013-05-09 DIAGNOSIS — D329 Benign neoplasm of meninges, unspecified: Secondary | ICD-10-CM

## 2013-05-10 ENCOUNTER — Ambulatory Visit
Admission: RE | Admit: 2013-05-10 | Discharge: 2013-05-10 | Disposition: A | Discharge status: Home / Self Care | Payer: 59 | Source: Ambulatory Visit | Attending: Radiation Oncology | Admitting: Radiation Oncology

## 2013-05-10 ENCOUNTER — Encounter: Payer: Self-pay | Admitting: Radiation Oncology

## 2013-05-10 DIAGNOSIS — D329 Benign neoplasm of meninges, unspecified: Secondary | ICD-10-CM

## 2013-05-10 NOTE — Progress Notes (Signed)
  Radiation Oncology         [phone] ________________________________  Name: Stephanie Beard MRN: [id number]  Date: 05/10/2013  DOB: [birth year]  Weekly Radiation Therapy Management  Current Dose: 3.6 Gy     Planned Dose:  52.2 Gy  Narrative . . . . . . . . The patient presents for routine under treatment assessment.                                                     The patient is without complaint.                                 Set-up films were reviewed.                                 The chart was checked. Physical Findings. . . . Weight essentially stable.  No significant changes. Impression . . . . . . . The patient is  tolerating radiation. Plan . . . . . . . . . . . . Continue treatment as planned.  ________________________________  Artist Pais. Kathrynn Running, M.D.

## 2013-05-13 NOTE — Progress Notes (Signed)
  Radiation Oncology         [phone] ________________________________  Name: Stephanie Beard MRN: [id number]  Date: 05/09/2013  DOB: 03/17/1956  Simulation Verification Note  Status: outpatient  NARRATIVE: The patient was brought to the treatment unit and placed in the planned treatment position. The clinical setup was verified. Then port films were obtained and uploaded to the radiation oncology medical record software.  The treatment beams were carefully compared against the planned radiation fields. The position location and shape of the radiation fields was reviewed. They targeted volume of tissue appears to be appropriately covered by the radiation beams. Organs at risk appear to be excluded as planned.  Based on my personal review, I approved the simulation verification. The patient's treatment will proceed as planned.  ------------------------------------------------  Artist Pais Kathrynn Running, M.D.

## 2013-05-14 ENCOUNTER — Ambulatory Visit
Admission: RE | Admit: 2013-05-14 | Discharge: 2013-05-14 | Disposition: A | Discharge status: Home / Self Care | Payer: 59 | Source: Ambulatory Visit | Attending: Radiation Oncology | Admitting: Radiation Oncology

## 2013-05-15 ENCOUNTER — Ambulatory Visit
Admission: RE | Admit: 2013-05-15 | Discharge: 2013-05-15 | Disposition: A | Discharge status: Home / Self Care | Payer: 59 | Source: Ambulatory Visit | Attending: Radiation Oncology | Admitting: Radiation Oncology

## 2013-05-16 ENCOUNTER — Ambulatory Visit
Admission: RE | Admit: 2013-05-16 | Discharge: 2013-05-16 | Disposition: A | Discharge status: Home / Self Care | Payer: 59 | Source: Ambulatory Visit | Attending: Radiation Oncology | Admitting: Radiation Oncology

## 2013-05-17 ENCOUNTER — Ambulatory Visit
Admission: RE | Admit: 2013-05-17 | Discharge: 2013-05-17 | Disposition: A | Discharge status: Home / Self Care | Payer: 59 | Source: Ambulatory Visit | Attending: Radiation Oncology | Admitting: Radiation Oncology

## 2013-05-17 ENCOUNTER — Encounter: Payer: Self-pay | Admitting: Radiation Oncology

## 2013-05-17 DIAGNOSIS — D329 Benign neoplasm of meninges, unspecified: Secondary | ICD-10-CM

## 2013-05-17 DIAGNOSIS — M2170 Unequal limb length (acquired), unspecified site: Secondary | ICD-10-CM

## 2013-05-17 NOTE — Progress Notes (Signed)
  Radiation Oncology         [phone] ________________________________  Name: Stephanie Beard MRN: [id number]  Date: 05/17/2013  DOB: Nov 05, 1955  Weekly Radiation Therapy Management  Current Dose: 10.8 Gy     Planned Dose:  52.2 Gy  Narrative . . . . . . . . The patient presents for routine under treatment assessment.                                                The patient is without complaint.                                 Set-up films were reviewed.                                 The chart was checked. Physical Findings. . . . Weight essentially stable.  No significant changes. Impression . . . . . . . The patient is  tolerating radiation. Plan . . . . . . . . . . . . Continue treatment as planned.  ________________________________  Artist Pais. Kathrynn Running, M.D.

## 2013-05-20 ENCOUNTER — Ambulatory Visit
Admission: RE | Admit: 2013-05-20 | Discharge: 2013-05-20 | Disposition: A | Discharge status: Home / Self Care | Payer: 59 | Source: Ambulatory Visit | Attending: Radiation Oncology | Admitting: Radiation Oncology

## 2013-05-21 ENCOUNTER — Ambulatory Visit
Admission: RE | Admit: 2013-05-21 | Discharge: 2013-05-21 | Disposition: A | Discharge status: Home / Self Care | Payer: 59 | Source: Ambulatory Visit | Attending: Radiation Oncology | Admitting: Radiation Oncology

## 2013-05-22 ENCOUNTER — Ambulatory Visit
Admission: RE | Admit: 2013-05-22 | Discharge: 2013-05-22 | Disposition: A | Discharge status: Home / Self Care | Payer: 59 | Source: Ambulatory Visit | Attending: Radiation Oncology | Admitting: Radiation Oncology

## 2013-05-23 ENCOUNTER — Ambulatory Visit
Admission: RE | Admit: 2013-05-23 | Discharge: 2013-05-23 | Disposition: A | Discharge status: Home / Self Care | Payer: 59 | Source: Ambulatory Visit | Attending: Radiation Oncology | Admitting: Radiation Oncology

## 2013-05-24 ENCOUNTER — Ambulatory Visit
Admission: RE | Admit: 2013-05-24 | Discharge: 2013-05-24 | Disposition: A | Discharge status: Home / Self Care | Payer: 59 | Source: Ambulatory Visit | Attending: Radiation Oncology | Admitting: Radiation Oncology

## 2013-05-24 ENCOUNTER — Encounter: Payer: Self-pay | Admitting: Radiation Oncology

## 2013-05-24 DIAGNOSIS — D329 Benign neoplasm of meninges, unspecified: Secondary | ICD-10-CM

## 2013-05-24 NOTE — Progress Notes (Signed)
  Radiation Oncology         [phone] ________________________________  Name: Stephanie Beard MRN: [id number]  Date: 05/24/2013  DOB: Mar 12, 1956  Weekly Radiation Therapy Management  Current Dose: reviewed     Planned Dose:  52.2 Gy  Narrative . . . . . . . . The patient presents for routine under treatment assessment.                                                      The patient is without complaint.                                 Set-up Exactrac films were reviewed.                                 The chart was checked. Physical Findings. . . . Weight essentially stable.  No significant changes. Impression . . . . . . . The patient is tolerating radiation. Plan . . . . . . . . . . . . Continue treatment as planned.  ________________________________  Artist Pais. Kathrynn Running, M.D.

## 2013-05-27 ENCOUNTER — Ambulatory Visit
Admission: RE | Admit: 2013-05-27 | Discharge: 2013-05-27 | Disposition: A | Discharge status: Home / Self Care | Payer: 59 | Source: Ambulatory Visit | Attending: Radiation Oncology | Admitting: Radiation Oncology

## 2013-05-28 ENCOUNTER — Ambulatory Visit
Admission: RE | Admit: 2013-05-28 | Discharge: 2013-05-28 | Disposition: A | Discharge status: Home / Self Care | Payer: 59 | Source: Ambulatory Visit | Attending: Radiation Oncology | Admitting: Radiation Oncology

## 2013-05-29 ENCOUNTER — Ambulatory Visit
Admission: RE | Admit: 2013-05-29 | Discharge: 2013-05-29 | Disposition: A | Discharge status: Home / Self Care | Payer: 59 | Source: Ambulatory Visit | Attending: Radiation Oncology | Admitting: Radiation Oncology

## 2013-05-29 ENCOUNTER — Encounter: Payer: Self-pay | Admitting: Radiation Oncology

## 2013-05-29 DIAGNOSIS — D329 Benign neoplasm of meninges, unspecified: Secondary | ICD-10-CM

## 2013-05-29 NOTE — Progress Notes (Signed)
  Radiation Oncology         [phone] ________________________________  Name: Stephanie Beard MRN: [id number]  Date: 05/29/2013  DOB: 01/23/1956  Weekly Radiation Therapy Management  Current Dose: reviewed/approved  Narrative . . . . . . . . The patient presents for routine under treatment assessment.                                                      The patient is without complaint.                                 Set-up films were reviewed.                                 The chart was checked. Physical Findings. . . . Weight essentially stable.  No significant changes. Impression . . . . . . . The patient is tolerating radiation. Plan . . . . . . . . . . . . Continue treatment as planned.  ________________________________  Artist Pais. Kathrynn Running, M.D.

## 2013-05-30 ENCOUNTER — Ambulatory Visit
Admission: RE | Admit: 2013-05-30 | Discharge: 2013-05-30 | Disposition: A | Discharge status: Home / Self Care | Payer: 59 | Source: Ambulatory Visit | Attending: Radiation Oncology | Admitting: Radiation Oncology

## 2013-05-30 ENCOUNTER — Ambulatory Visit: Admission: RE | Admit: 2013-05-30 | Payer: 59 | Source: Ambulatory Visit | Admitting: Radiation Oncology

## 2013-05-31 ENCOUNTER — Ambulatory Visit
Admission: RE | Admit: 2013-05-31 | Discharge: 2013-05-31 | Disposition: A | Discharge status: Home / Self Care | Payer: 59 | Source: Ambulatory Visit | Attending: Radiation Oncology | Admitting: Radiation Oncology

## 2013-06-03 ENCOUNTER — Encounter: Payer: Self-pay | Admitting: Nurse Practitioner

## 2013-06-03 ENCOUNTER — Ambulatory Visit
Admission: RE | Admit: 2013-06-03 | Discharge: 2013-06-03 | Disposition: A | Discharge status: Home / Self Care | Payer: 59 | Source: Ambulatory Visit | Attending: Radiation Oncology | Admitting: Radiation Oncology

## 2013-06-03 DIAGNOSIS — D329 Benign neoplasm of meninges, unspecified: Secondary | ICD-10-CM

## 2013-06-04 ENCOUNTER — Ambulatory Visit
Admission: RE | Admit: 2013-06-04 | Discharge: 2013-06-04 | Disposition: A | Discharge status: Home / Self Care | Payer: 59 | Source: Ambulatory Visit | Attending: Radiation Oncology | Admitting: Radiation Oncology

## 2013-06-04 ENCOUNTER — Encounter: Payer: Self-pay | Admitting: Nurse Practitioner

## 2013-06-04 ENCOUNTER — Other Ambulatory Visit (HOSPITAL_COMMUNITY): Payer: Self-pay | Admitting: Family Medicine

## 2013-06-04 ENCOUNTER — Ambulatory Visit (INDEPENDENT_AMBULATORY_CARE_PROVIDER_SITE_OTHER): Payer: 59 | Admitting: Sports Medicine

## 2013-06-04 ENCOUNTER — Ambulatory Visit (INDEPENDENT_AMBULATORY_CARE_PROVIDER_SITE_OTHER): Payer: 59 | Admitting: Nurse Practitioner

## 2013-06-04 VITALS — BP 124/87 | Ht 61.0 in | Wt 135.0 lb

## 2013-06-04 VITALS — BP 140/86 | HR 72 | Resp 16 | Ht 61.0 in | Wt 140.0 lb

## 2013-06-04 DIAGNOSIS — Z5189 Encounter for other specified aftercare: Secondary | ICD-10-CM

## 2013-06-04 DIAGNOSIS — S76311D Strain of muscle, fascia and tendon of the posterior muscle group at thigh level, right thigh, subsequent encounter: Secondary | ICD-10-CM

## 2013-06-04 DIAGNOSIS — Z1231 Encounter for screening mammogram for malignant neoplasm of breast: Secondary | ICD-10-CM

## 2013-06-04 DIAGNOSIS — Z01419 Encounter for gynecological examination (general) (routine) without abnormal findings: Secondary | ICD-10-CM

## 2013-06-04 DIAGNOSIS — Z0000 Encounter for general adult medical examination without abnormal findings: Secondary | ICD-10-CM

## 2013-06-04 LAB — POCT URINALYSIS DIPSTICK
Ketones, UA: NEGATIVE
Leukocytes, UA: NEGATIVE
Protein, UA: NEGATIVE
Urobilinogen, UA: NEGATIVE
pH, UA: 7

## 2013-06-04 MED ORDER — ESTRADIOL 0.5 MG PO TABS: 0.5000 mg | ORAL_TABLET | Freq: Every day | ORAL | Status: DC

## 2013-06-04 NOTE — Assessment & Plan Note (Signed)
This is much improved  The bursitis noted has essentially resolved  See visit note for progressive HEP

## 2013-06-04 NOTE — Progress Notes (Signed)
  Subjective:    Patient ID: Stephanie Beard, female    DOB: 1955/11/28, 57 y.o.   MRN: [id number]  HPI  57 yo currently being treated for meningioma of the optic nerve with rad onc s/p R gastroc tear presents for follow up   Pt states that compression sleeve and heel lift exercises have helped significantly Pain in the posterior knee has resolved No swelling appreciated Pt notes some anterior knee pain now 7/10 pain, sharp stabbing with eliptical 10 days ago Currently pain 1/10, 4/10 with basic walking Difficult to walk up and down stairs No remote hx of knee injury No recent mechanism of injury   Review of Systems Per HPI    Objective:   Physical Exam General: healthy appearing woman in no acute distress Resp: non labored breathing MSK: R knee: Appearance: no edema, ecchymosis or atrophy Palpation: medial joint line tenderness, no lateral joint line tenderness. Patellar facet non tender to palpation med/lat. LCL/MCL origin/insertion non tender to palpation. Medial Plica non tender. No posterior knee fullness or tenderness to palpation ROM: extension/flexion wnl Special tests: varus/valgus stable, lachman negative, mcmurray negative for clicks or pain  Able to do 1 leg heel raise with repeats and no pain  MSK [place] Right Knee: lateral gastroc bursitis resolved. Medial meniscus fraying with minimal fluid enhancement.  Prior lateral gastroc/HS tendon bursitis is minimal now;  Meidal gastroc shows very minimal fluid    Assessment & Plan:  Posterior Knee pain: Right gastrocnemius tendon sheath tear. improving significantly. - cont compression sleeve - cont home exercises - begin walk/run in 3 weeks - may start to attempt normal running in 6 weeks - f/u in 6 weeks  Anterior Knee Pain: chronic meniscal fraying - cont to monitor - consider formal physical therapy if no improvement  Pt care seen and discussed with dr. Doreene Eland, PGY-3

## 2013-06-04 NOTE — Progress Notes (Signed)
Patient ID: Stephanie Beard, female   DOB: Sep 27, 1955, 57 y.o.   MRN: [id number] 57 y.o. G2P2002 Married Caucasian Fe here for annual exam.  New diagnosis of Meningioma was made after trouble with vision.  New treatment with radiation is to be started soon.  No LMP recorded. Patient has had a hysterectomy.          Sexually active: yes  The current method of family planning is surgical.    Exercising: yes  walking, running and yoga Smoker:  no  Health Maintenance: Pap:  05/25/10, WNL MMG:  04/17/12, BI-Rads 1: negative Colonoscopy:  03/02/09, normal 10 year recall BMD:   10/06/08  T Score:  spine -0.6; left hip neck -1.3; right hip neck -1.7 TDaP:  06/05/08 Labs: HB: declined Urine: negative   reports that she has never smoked. She has never used smokeless tobacco. She reports that  drinks alcohol. She reports that she does not use illicit drugs.  Past Medical History  Diagnosis Date  . Meningioma 04/2013    brain MRI and treatment with radiation  . Hypertension 2012    Past Surgical History  Procedure Laterality Date  . Wisdom tooth extraction  1978  . Tubal ligation Bilateral 1992  . Vaginal hysterectomy  1993    secondary prolapse w/A&P repair  . Anterior and posterior vaginal repair  1993    along with TVH  . Cesarean section  1990    Current Outpatient Prescriptions  Medication Sig Dispense Refill  . aspirin EC 81 MG tablet Take 81 mg by mouth daily.      . calcium-vitamin D 250-100 MG-UNIT per tablet Take 1 tablet by mouth daily.       . hydrochlorothiazide (HYDRODIURIL) 25 MG tablet Take 25 mg by mouth every morning.       . Multiple Vitamin (MULTIVITAMIN WITH MINERALS) TABS tablet Take 1 tablet by mouth daily.      . naproxen sodium (ANAPROX) 220 MG tablet Take 220 mg by mouth 2 (two) times daily as needed (for pain).      Marland Kitchen estradiol (ESTRACE) 0.5 MG tablet Take 1 tablet (0.5 mg total) by mouth daily.  90 tablet  0   No current facility-administered medications for this  visit.    Family History  Problem Relation Age of Onset  . Hypertension Mother   . Hypercholesterolemia Mother   . Heart disease Mother   . Heart disease Father   . Hypertension Father   . Hypertension Brother   . Hyperlipidemia Brother     ROS:  Pertinent items are noted in HPI.  Otherwise, a comprehensive ROS was negative.  Exam:   BP 140/86  Pulse 72  Resp 16  Ht 5\' 1"  (1.549 m)  Wt 140 lb (63.504 kg)  BMI 26.47 kg/m2 Height: 5\' 1"  (154.9 cm)  Ht Readings from Last 3 Encounters:  06/04/13 5\' 1"  (1.549 m)  06/04/13 5\' 1"  (1.549 m)  05/08/13 5\' 1"  (1.549 m)    General appearance: alert, cooperative and appears stated age Head: Normocephalic, without obvious abnormality, atraumatic Neck: no adenopathy, supple, symmetrical, trachea midline and thyroid normal to inspection and palpation Lungs: clear to auscultation bilaterally Breasts: normal appearance, no masses or tenderness Heart: regular rate and rhythm Abdomen: soft, non-tender; no masses,  no organomegaly Extremities: extremities normal, atraumatic, no cyanosis or edema.  Right knee brace is on. Skin: Skin color, texture, turgor normal. No rashes or lesions Lymph nodes: Cervical, supraclavicular, and axillary nodes normal. No abnormal  inguinal nodes palpated Neurologic: Grossly normal   Pelvic: External genitalia:  no lesions              Urethra:  normal appearing urethra with no masses, tenderness or lesions              Bartholin's and Skene's: normal                 Vagina: normal appearing vagina with normal color and discharge, no lesions              Cervix: absent              Pap taken: no Bimanual Exam:  Uterus:  uterus absent              Adnexa: no mass, fullness, tenderness               Rectovaginal: Confirms               Anus:  normal sphincter tone, no lesions  A:  Well Woman with normal exam  Postmenopausal on ERT since 12/2008  S/P TVH and AP repair 1993  New diagnosis of Meningioma and  will be having radiation treatments  P:   Pap smear as per guidelines - Not indicated  Refill on Estrace # 90 only without refill until Mammo is done. She is also considering tapering off this year.  Discussed potential risk with DVT, CVA, cancer, etc.  Counseled on breast self exam, osteoporosis, adequate intake of calcium and vitamin D, diet and exercise return annually or prn  An After Visit Summary was printed and given to the patient.

## 2013-06-04 NOTE — Patient Instructions (Signed)
Work to [employer] of 15 reps for heel raises  Continue to use heel lift  Continue to use compression sleeve  F/U in 3 weeks, at that time may begin to start walk/running  At 6 weeks, the goal is to progress from walk/run to running

## 2013-06-04 NOTE — Patient Instructions (Signed)

## 2013-06-05 ENCOUNTER — Ambulatory Visit
Admission: RE | Admit: 2013-06-05 | Discharge: 2013-06-05 | Disposition: A | Discharge status: Home / Self Care | Payer: 59 | Source: Ambulatory Visit | Attending: Radiation Oncology | Admitting: Radiation Oncology

## 2013-06-06 ENCOUNTER — Ambulatory Visit
Admission: RE | Admit: 2013-06-06 | Discharge: 2013-06-06 | Disposition: A | Discharge status: Home / Self Care | Payer: 59 | Source: Ambulatory Visit | Attending: Radiation Oncology | Admitting: Radiation Oncology

## 2013-06-07 ENCOUNTER — Encounter: Payer: Self-pay | Admitting: Radiation Oncology

## 2013-06-07 ENCOUNTER — Ambulatory Visit
Admission: RE | Admit: 2013-06-07 | Discharge: 2013-06-07 | Disposition: A | Discharge status: Home / Self Care | Payer: 59 | Source: Ambulatory Visit | Attending: Radiation Oncology | Admitting: Radiation Oncology

## 2013-06-07 DIAGNOSIS — D329 Benign neoplasm of meninges, unspecified: Secondary | ICD-10-CM

## 2013-06-07 NOTE — Progress Notes (Signed)
  Radiation Oncology         [phone] ________________________________  Name: Stephanie Beard MRN: [id number]  Date: 06/07/2013  DOB: May 30, 1956  Weekly Radiation Therapy Management  Current Dose: 37.8 Gy     Planned Dose:  52.2 Gy  Narrative . . . . . . . . The patient presents for routine under treatment assessment.                                               The patient is without complaint.                                 Set-up films were reviewed.                                 The chart was checked. Physical Findings. . . . Weight essentially stable.  No significant changes. Impression . . . . . . . The patient is tolerating radiation. Plan . . . . . . . . . . . . Continue treatment as planned.  ________________________________  Artist Pais. Kathrynn Running, M.D.

## 2013-06-08 ENCOUNTER — Encounter: Payer: Self-pay | Admitting: Radiation Oncology

## 2013-06-08 NOTE — Progress Notes (Signed)
  Radiation Oncology         [phone] ________________________________  Name: Stephanie Beard MRN: [id number]  Date: 06/03/2013  DOB: 09-04-1956  Weekly Radiation Therapy Management  Current Dose: 30.6 Gy     Planned Dose:  52.2 Gy  Narrative . . . . . . . . The patient presents for routine under treatment assessment.                                                      The patient is without complaint.                                 Set-up films were reviewed.                                 The chart was checked. Physical Findings. . . . Weight essentially stable.  No significant changes. Impression . . . . . . . The patient is tolerating radiation. Plan . . . . . . . . . . . . Continue treatment as planned.  ________________________________  Artist Pais. Kathrynn Running, M.D.

## 2013-06-10 ENCOUNTER — Ambulatory Visit
Admission: RE | Admit: 2013-06-10 | Discharge: 2013-06-10 | Disposition: A | Discharge status: Home / Self Care | Payer: 59 | Source: Ambulatory Visit | Attending: Radiation Oncology | Admitting: Radiation Oncology

## 2013-06-10 NOTE — Progress Notes (Signed)
Encounter reviewed by Dr. Harish Bram Silva.  

## 2013-06-11 ENCOUNTER — Ambulatory Visit
Admission: RE | Admit: 2013-06-11 | Discharge: 2013-06-11 | Disposition: A | Discharge status: Home / Self Care | Payer: 59 | Source: Ambulatory Visit | Attending: Radiation Oncology | Admitting: Radiation Oncology

## 2013-06-12 ENCOUNTER — Ambulatory Visit
Admission: RE | Admit: 2013-06-12 | Discharge: 2013-06-12 | Disposition: A | Discharge status: Home / Self Care | Payer: 59 | Source: Ambulatory Visit | Attending: Radiation Oncology | Admitting: Radiation Oncology

## 2013-06-13 ENCOUNTER — Ambulatory Visit
Admission: RE | Admit: 2013-06-13 | Discharge: 2013-06-13 | Disposition: A | Discharge status: Home / Self Care | Payer: 59 | Source: Ambulatory Visit | Attending: Radiation Oncology | Admitting: Radiation Oncology

## 2013-06-14 ENCOUNTER — Ambulatory Visit
Admission: RE | Admit: 2013-06-14 | Discharge: 2013-06-14 | Disposition: A | Discharge status: Home / Self Care | Payer: 59 | Source: Ambulatory Visit | Attending: Radiation Oncology | Admitting: Radiation Oncology

## 2013-06-14 DIAGNOSIS — D329 Benign neoplasm of meninges, unspecified: Secondary | ICD-10-CM

## 2013-06-16 ENCOUNTER — Encounter: Payer: Self-pay | Admitting: Radiation Oncology

## 2013-06-17 ENCOUNTER — Ambulatory Visit
Admission: RE | Admit: 2013-06-17 | Discharge: 2013-06-17 | Disposition: A | Discharge status: Home / Self Care | Payer: 59 | Source: Ambulatory Visit | Attending: Radiation Oncology | Admitting: Radiation Oncology

## 2013-06-18 ENCOUNTER — Ambulatory Visit
Admission: RE | Admit: 2013-06-18 | Discharge: 2013-06-18 | Disposition: A | Discharge status: Home / Self Care | Payer: 59 | Source: Ambulatory Visit | Attending: Radiation Oncology | Admitting: Radiation Oncology

## 2013-06-19 ENCOUNTER — Ambulatory Visit
Admission: RE | Admit: 2013-06-19 | Discharge: 2013-06-19 | Disposition: A | Discharge status: Home / Self Care | Payer: 59 | Source: Ambulatory Visit | Attending: Radiation Oncology | Admitting: Radiation Oncology

## 2013-06-19 ENCOUNTER — Encounter: Payer: Self-pay | Admitting: Radiation Oncology

## 2013-06-19 ENCOUNTER — Ambulatory Visit: Payer: 59

## 2013-06-19 VITALS — BP 153/99 | HR 68 | Resp 16 | Wt 135.0 lb

## 2013-06-19 DIAGNOSIS — D329 Benign neoplasm of meninges, unspecified: Secondary | ICD-10-CM

## 2013-06-19 NOTE — Progress Notes (Signed)
Reports intermittent daily headaches relieved with motrin. Reports vision has improved greatly. Reports mild fatigue. No complaints.

## 2013-06-19 NOTE — Progress Notes (Signed)
  Radiation Oncology         [phone] ________________________________  Name: Samreen Seltzer MRN: [id number]  Date: 06/19/2013  DOB: [birth year]  Weekly Radiation Therapy Management  Current Dose: 52.2 Gy     Planned Dose:  52.2 Gy  Narrative . . . . . . . . The patient presents for the final under treatment assessment.                                           The patient has had some continuation of previously noted headaches.                                 Set-up films were reviewed.                                 The chart was checked. Physical Findings. . . Weight essentially stable.  No significant changes. Impression . . . . . . . The patient tolerated radiation relatively well. Plan . . . . . . . . . . . . Complete radiation today as scheduled, and follow-up in one month. The patient was encouraged to call or return to the clinic in the interim for any worsening symptoms.  ________________________________  Artist Pais Kathrynn Running, M.D.

## 2013-06-20 ENCOUNTER — Ambulatory Visit: Payer: 59

## 2013-06-20 NOTE — Progress Notes (Signed)
  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  Indication for treatment:  Curative, Preservation of Cranial Nerve Function/Vision       Radiation treatment dates:  05/09/2013-06/19/2013  Site/dose:   The meningioma was conformally treated to 52.2 Gy in 29 fractions of 1.8 Gy  Beams/energy:   Fractionated stereotactic radiotherapy was used to conform the radiation distribution around the target given the proximity of eyes, optic chiasm and brainstem.  4 volumetric arc therapy beams were used with two at the couch zero angle, and one at 60 degrees and one at 310 degrees.  Narrative: The patient tolerated radiation treatment relatively well.   Her headache and diplopia improved with no significant side effects.  Plan: The patient has completed radiation treatment. The patient will return to radiation oncology clinic for routine followup in one month. I advised them to call or return sooner if they have any questions or concerns related to their recovery or treatment. ________________________________  Artist Pais. Kathrynn Running, M.D.

## 2013-06-24 ENCOUNTER — Ambulatory Visit (HOSPITAL_COMMUNITY)
Admission: RE | Admit: 2013-06-24 | Discharge: 2013-06-24 | Disposition: A | Discharge status: Home / Self Care | Payer: 59 | Source: Ambulatory Visit | Attending: Family Medicine | Admitting: Family Medicine

## 2013-06-24 DIAGNOSIS — Z1231 Encounter for screening mammogram for malignant neoplasm of breast: Secondary | ICD-10-CM | POA: Insufficient documentation

## 2013-07-16 ENCOUNTER — Ambulatory Visit: Payer: 59 | Admitting: Sports Medicine

## 2013-07-18 ENCOUNTER — Other Ambulatory Visit (HOSPITAL_COMMUNITY): Payer: Self-pay | Admitting: Specialist

## 2013-07-18 ENCOUNTER — Other Ambulatory Visit: Payer: Self-pay

## 2013-07-18 DIAGNOSIS — M25561 Pain in right knee: Secondary | ICD-10-CM

## 2013-07-22 ENCOUNTER — Telehealth: Payer: Self-pay | Admitting: Radiation Oncology

## 2013-07-22 NOTE — Telephone Encounter (Signed)
Phoned patient and explained that Dr. Kathrynn Running agrees her MRI should be done before the end of December. Copied Darryl Nestle in this note to make her aware.

## 2013-07-23 ENCOUNTER — Ambulatory Visit (HOSPITAL_COMMUNITY)
Admission: RE | Admit: 2013-07-23 | Discharge: 2013-07-23 | Disposition: A | Discharge status: Home / Self Care | Payer: 59 | Source: Ambulatory Visit | Attending: Specialist | Admitting: Specialist

## 2013-07-23 DIAGNOSIS — X58XXXA Exposure to other specified factors, initial encounter: Secondary | ICD-10-CM | POA: Insufficient documentation

## 2013-07-23 DIAGNOSIS — M25569 Pain in unspecified knee: Secondary | ICD-10-CM | POA: Insufficient documentation

## 2013-07-23 DIAGNOSIS — Y9302 Activity, running: Secondary | ICD-10-CM | POA: Insufficient documentation

## 2013-07-23 DIAGNOSIS — M25561 Pain in right knee: Secondary | ICD-10-CM

## 2013-07-23 DIAGNOSIS — M7120 Synovial cyst of popliteal space [Baker], unspecified knee: Secondary | ICD-10-CM | POA: Insufficient documentation

## 2013-07-23 DIAGNOSIS — IMO0002 Reserved for concepts with insufficient information to code with codable children: Secondary | ICD-10-CM | POA: Insufficient documentation

## 2013-07-23 DIAGNOSIS — M25469 Effusion, unspecified knee: Secondary | ICD-10-CM | POA: Insufficient documentation

## 2013-07-25 ENCOUNTER — Other Ambulatory Visit: Payer: Self-pay | Admitting: Radiation Therapy

## 2013-07-25 DIAGNOSIS — D320 Benign neoplasm of cerebral meninges: Secondary | ICD-10-CM

## 2013-08-01 ENCOUNTER — Other Ambulatory Visit: Payer: Self-pay | Admitting: Orthopedic Surgery

## 2013-08-05 ENCOUNTER — Encounter: Payer: Self-pay | Admitting: Radiation Oncology

## 2013-08-05 ENCOUNTER — Other Ambulatory Visit: Payer: Self-pay | Admitting: Orthopedic Surgery

## 2013-08-05 ENCOUNTER — Ambulatory Visit
Admission: RE | Admit: 2013-08-05 | Discharge: 2013-08-05 | Disposition: A | Discharge status: Home / Self Care | Payer: 59 | Source: Ambulatory Visit | Attending: Radiation Oncology | Admitting: Radiation Oncology

## 2013-08-05 VITALS — BP 123/90 | HR 83 | Temp 98.0°F | Resp 16 | Wt 140.5 lb

## 2013-08-05 DIAGNOSIS — D329 Benign neoplasm of meninges, unspecified: Secondary | ICD-10-CM

## 2013-08-05 NOTE — Progress Notes (Signed)
Reports that dull daily headaches continue. Reports taking motrin to relieve these headaches. Reports that headaches are always felt in the top left of her head. Reports her vision has greatly improved. Excited that she can drive at night now. Reports that floaters continue. Reports when she looks far right she does see double. Denies ringing in the ears, nausea, vomiting, or dizziness.

## 2013-08-05 NOTE — Progress Notes (Signed)
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  Reports that dull daily headaches continue. Reports taking motrin to relieve these headaches. Reports that headaches are always felt in the top left of her head. Reports her vision has greatly improved. Excited that she can drive at night now. Reports that floaters continue. Reports when she looks far right she does see double. Denies ringing in the ears, nausea, vomiting, or dizziness                          ALLERGIES:  has No Known Allergies.  Meds: Current Outpatient Prescriptions  Medication Sig Dispense Refill  . aspirin EC 81 MG tablet Take 81 mg by mouth daily.      . calcium-vitamin D 250-100 MG-UNIT per tablet Take 1 tablet by mouth daily.       Marland Kitchen estradiol (ESTRACE) 0.5 MG tablet Take 1 tablet (0.5 mg total) by mouth daily.  90 tablet  0  . hydrochlorothiazide (HYDRODIURIL) 25 MG tablet Take 25 mg by mouth every morning.       Marland Kitchen ibuprofen (ADVIL,MOTRIN) 200 MG tablet Take 200 mg by mouth every 6 (six) hours as needed.      . Multiple Vitamin (MULTIVITAMIN WITH MINERALS) TABS tablet Take 1 tablet by mouth daily.      . naproxen sodium (ANAPROX) 220 MG tablet Take 220 mg by mouth 2 (two) times daily as needed (for pain).       No current facility-administered medications for this encounter.    Physical Findings: The patient is in no acute distress. Patient is alert and oriented.  weight is 140 lb 8 oz (63.73 kg). Her oral temperature is 98 F (36.7 C). Her blood pressure is 123/90 and her pulse is 83. Her respiration is 16 and oxygen saturation is 100%. .  No significant changes.  Lab Findings: No results found for this basename: WBC,  HGB,  HCT,  MCV,  PLT    @LASTCHEM @  Radiographic Findings: Mr Knee Right Wo Contrast  07/23/2013   CLINICAL DATA:  Running injury, pain and swelling of the knee  EXAM: MRI OF THE RIGHT KNEE WITHOUT CONTRAST  TECHNIQUE: Multiplanar, multisequence MR imaging of the knee was performed. No intravenous contrast was administered.  COMPARISON:  Right knee x-ray 05/01/2013.  FINDINGS: MENISCI  Medial meniscus: There is a radial tear at the posterior horn of the medial meniscus adjacent to the meniscal root with peripheral meniscal extrusion. There is a tear of the posterior horn body junction of the medial meniscus extending to the inferior articular surface.  Lateral meniscus:  Intact.  LIGAMENTS  Cruciates:  Intact.  Collaterals: Medial collateral ligament is intact. Lateral collateral ligament complex is intact.  CARTILAGE  Patellofemoral: There is partial thickness cartilage loss of the lateral patellar facet.  Medial: There is full-thickness cartilage loss peripherally involving the medial femoral condyle and medial tibial plateau with  subchondral reactive changes greater in the medial femoral condyle.  Lateral:  No focal defect.  Joint: There is a moderate joint effusion. There is mild fat stranding in Hoffa's fat. There is no plical thickening.  Popliteal Fossa:  There is a small multiloculated Baker's cyst  Extensor Mechanism:  Intact.  Bones:  There are no other areas of marrow signal abnormality.  IMPRESSION: 1. Radial tear of the posterior horn of the medial meniscus adjacent to the meniscal root with peripheral meniscal extrusion. There is additionally a tear of the posterior horn- body junction of the medial meniscus extending to the inferior  articular surface.  2. There is full-thickness cartilage loss peripherally involving the medial femoral condyle and medial tibial plateau with surrounding subchondral reactive changes.   Electronically Signed   By: Elige Ko   On: 07/23/2013 11:20    Impression:  The patient is recovering from the effects of radiation.  Plan:  She has a post-radiotherapy baseline MRI set up on 08/14/13  _____________________________________  Artist Pais. Kathrynn Running, M.D.

## 2013-08-05 NOTE — H&P (Signed)
Stephanie Beard is an 57 y.o. female.   Chief Complaint: right knee pain HPI: The patient is a 57 year old female being followed for their right knee pain. They are now 3 month(s) out from when symptoms began (after running). Symptoms reported today include: pain, swelling (mild), stiffness and instability, while the patient does not report symptoms of: giving way. Current treatment includes: home exercise program, relative rest, activity modification and Aleve. The following medication has been used for pain control: antiinflammatory medication. The patient has reported symptom improvement with: Cortisone injections (short term relief) and NSAIDs while they have not gotten any relief of their symptoms with: bracing. Stephanie Beard follows up today for her right knee. She reports she has actually had less buckling since her last visit although it still feels unstable. The last few days steps have been easier to her to do but she still has pain when pivoting. She does sit at work. She is taking NSAIDs prn. She denies any other injury to cause her pain.  Past Medical History  Diagnosis Date  . Meningioma 04/2013    brain MRI and treatment with radiation  . Hypertension 2012    Past Surgical History  Procedure Laterality Date  . Wisdom tooth extraction  1978  . Tubal ligation Bilateral 1992  . Vaginal hysterectomy  1993    secondary prolapse w/A&P repair  . Anterior and posterior vaginal repair  1993    along with TVH  . Cesarean section  1990    Family History  Problem Relation Age of Onset  . Hypertension Mother   . Hypercholesterolemia Mother   . Heart disease Mother   . Heart disease Father   . Hypertension Father   . Hypertension Brother   . Hyperlipidemia Brother    Social History:  reports that she has never smoked. She has never used smokeless tobacco. She reports that she drinks alcohol. She reports that she does not use illicit drugs.  Allergies: No Known Allergies   (Not in a  hospital admission)  No results found for this or any previous visit (from the past 48 hour(s)). No results found.  Review of Systems  Constitutional: Negative.   HENT: Negative.   Eyes: Negative.   Respiratory: Negative.   Cardiovascular: Negative.   Gastrointestinal: Negative.   Genitourinary: Negative.   Musculoskeletal: Positive for joint pain.  Skin: Negative.   Neurological: Negative.   Psychiatric/Behavioral: Negative.     There were no vitals taken for this visit. Physical Exam  Constitutional: She is oriented to person, place, and time. She appears well-developed and well-nourished.  HENT:  Head: Normocephalic and atraumatic.  Eyes: Conjunctivae and EOM are normal. Pupils are equal, round, and reactive to light.  Neck: Normal range of motion. Neck supple.  Cardiovascular: Normal rate and regular rhythm.   Respiratory: Effort normal and breath sounds normal.  GI: Soft. Bowel sounds are normal.  Musculoskeletal:  General Mental Status - Alert. General Appearance- pleasant. Not in acute distress. Orientation- Oriented X3. Build & Nutrition- Well nourished and Well developed. Gait- Limping.  Musculoskeletal Lower Extremity Right Lower Extremity: Right Knee: Inspection and Palpation:Tenderness- medial joint line tender to palpation. no tenderness to palpation of the posterior knee, no tenderness to palpation of the superior calf, no tenderness to palpation of the pes anserine bursa, no tenderness to palpation of the quadriceps tendon, no tenderness to palpation of the patellar tendon, no tenderness to palpation of the patella, no tenderness to palpation of the lateral joint line,   no tenderness to palpation of the fibular head and no tenderness to palpation of the peroneal nerve. Effusion- trace. Tissue tension/texture is - soft. Crepitus- moderate patellofemoral crepitus. Pulses- 2+. Sensation- intact to light touch. Skin- Color- no ecchymosis and no  erythema. Strength and Tone:Quadriceps- 5/5. Hamstrings- 5/5. ROM: Flexion:AROM- 120 . Extension:AROM- 0 . Stability- Valgus Laxity at 30- None. Valgus Laxity at 0- None. Varus Laxity at 30- None. Varus Laxity at 0- None. Lachman- Negative. Anterior Drawer Test - Negative. Posterior Drawer Test- Negative. Deformities/Malalignments/Discrepancies- no deformities noted. Special Tests:McMurray Test (lateral) - negative. McMurray Test (medial)- painful. Patellar Compression Pain- mild pain.  Neurological: She is alert and oriented to person, place, and time. She has normal reflexes.  Skin: Skin is warm and dry.  Psychiatric: She has a normal mood and affect.    prior standing xrays reviewed by Dr. Beane with moderate tricompartmental degenerative changes, slightly worse right than left medial joint space.  MRI R knee images and report reviewed by Dr. Beane with MMT, posterior horn near the root with peripheral extrusion, as well as a tear in the body as well. Partial thickness cartilage loss patella, full thickness cartilage loss medial femoral condyle and medial tibial plateau with stress rection change in the bone as well consistent with bone bruising.  Assessment/Plan Right knee MMT, DJD  I had a long discussion with the patient concerning the risks and benefits of knee arthroscopy including help from the arthroscopic procedure as well as no help from the arthroscopic procedure or worsening of symptoms. Also discussed infection, DVT, PE, anesthetic complications, etc. Also discussed the possibility of repeat arthroscopic surgery required in the future or total knee replacement. I provided the patient with an illustrated handout and discussed it in detail as well as discussed the postoperative and perioperative courses and return to functional activities including work. Need for postoperative DVT prophylaxis was discussed as well.  Pt with R knee pain, swelling, mechanical  symptoms due to MMT, underlying DJD. We discussed her dx and results in detail, reviewed relevant anatomy. Refractory to steroid injection, bracing, relative rest, activity modification, quad strengthening, NSAIDs, ice and elevation. Given her ongoing symptoms especially mechanical in nature, it is reasonable to proceed with surgery at this point as we previously discussed, which would be R knee arthroscopy, partial medial meniscectomy, debridement, and possible microfracture/drilling. We discussed the procedure itself in detail as well as risks, complications, and alternatives including but not limited to DVT, PE, infx, bleeding, failure of procedure, need for secondary procedure, anesthesia risk, even death. Discussed post-op protocols, expected outcome, time out of work, importance of elevation and activity modifications, possible need for limited weightbearing if we do proceed with microfracture on a weightbearing surface, possible post-op stiffness, need for repeat steroid and/or viscosupplementation injections. All questions were answered and she desires to proceed. We will proceed accordingly and get her scheduled. No DVT or MRSA hx. We did discuss gradual increase of activity post-op, from biking to elliptical to running on a rubberized track and as low impact as possible to prevent exacerbation. She will follow up 10-14 days post-op for suture removal and call with any questions or concerns in the interim.  plan right knee arthroscopy, partial medial meniscectomy, debridement, possible microfracture/drilling  BISSELL, JACLYN M. for Dr. Beane 08/05/2013, 8:08 AM    

## 2013-08-13 ENCOUNTER — Ambulatory Visit
Admission: RE | Admit: 2013-08-13 | Discharge: 2013-08-13 | Disposition: A | Discharge status: Home / Self Care | Payer: 59 | Source: Ambulatory Visit | Attending: Radiation Oncology | Admitting: Radiation Oncology

## 2013-08-13 ENCOUNTER — Encounter (HOSPITAL_COMMUNITY): Payer: Self-pay | Admitting: Pharmacy Technician

## 2013-08-13 DIAGNOSIS — D320 Benign neoplasm of cerebral meninges: Secondary | ICD-10-CM | POA: Insufficient documentation

## 2013-08-13 LAB — BUN AND CREATININE (CC13): BUN: 13.2 mg/dL (ref 7.0–26.0)

## 2013-08-14 ENCOUNTER — Ambulatory Visit
Admission: RE | Admit: 2013-08-14 | Discharge: 2013-08-14 | Disposition: A | Discharge status: Home / Self Care | Payer: 59 | Source: Ambulatory Visit | Attending: Radiation Oncology | Admitting: Radiation Oncology

## 2013-08-14 DIAGNOSIS — D320 Benign neoplasm of cerebral meninges: Secondary | ICD-10-CM

## 2013-08-14 MED ORDER — GADOBENATE DIMEGLUMINE 529 MG/ML IV SOLN
13.0000 mL | Freq: Once | INTRAVENOUS | Status: AC | PRN
  Administered 2013-08-14: 13 mL via INTRAVENOUS

## 2013-08-16 ENCOUNTER — Encounter (HOSPITAL_COMMUNITY): Payer: Self-pay

## 2013-08-16 ENCOUNTER — Ambulatory Visit (HOSPITAL_COMMUNITY)
Admission: RE | Admit: 2013-08-16 | Discharge: 2013-08-16 | Disposition: A | Discharge status: Home / Self Care | Payer: 59 | Source: Ambulatory Visit | Attending: Specialist | Admitting: Specialist

## 2013-08-16 ENCOUNTER — Encounter (HOSPITAL_COMMUNITY)
Admission: RE | Admit: 2013-08-16 | Discharge: 2013-08-16 | Disposition: A | Discharge status: Home / Self Care | Payer: 59 | Source: Ambulatory Visit | Attending: Specialist | Admitting: Specialist

## 2013-08-16 DIAGNOSIS — Z01810 Encounter for preprocedural cardiovascular examination: Secondary | ICD-10-CM | POA: Insufficient documentation

## 2013-08-16 DIAGNOSIS — I10 Essential (primary) hypertension: Secondary | ICD-10-CM | POA: Insufficient documentation

## 2013-08-16 DIAGNOSIS — R918 Other nonspecific abnormal finding of lung field: Secondary | ICD-10-CM | POA: Insufficient documentation

## 2013-08-16 DIAGNOSIS — Z01818 Encounter for other preprocedural examination: Secondary | ICD-10-CM | POA: Insufficient documentation

## 2013-08-16 DIAGNOSIS — Z01812 Encounter for preprocedural laboratory examination: Secondary | ICD-10-CM | POA: Insufficient documentation

## 2013-08-16 HISTORY — DX: Unspecified osteoarthritis, unspecified site: M19.90

## 2013-08-16 LAB — BASIC METABOLIC PANEL
CO2: 28 mEq/L (ref 19–32)
Chloride: 102 mEq/L (ref 96–112)
Creatinine, Ser: 0.64 mg/dL (ref 0.50–1.10)
GFR calc Af Amer: >90 mL/min (ref >90)
Glucose, Bld: 95 mg/dL (ref 70–99)
Potassium: 3.6 mEq/L (ref 3.5–5.1)
Sodium: 140 mEq/L (ref 135–145)

## 2013-08-16 LAB — CBC
MCH: 33.3 pg (ref 26.0–34.0)
MCHC: 34.1 g/dL (ref 30.0–36.0)
MCV: 97.9 fL (ref 78.0–100.0)
Platelets: 288 10*3/uL (ref 150–400)
RBC: 3.78 MIL/uL — ABNORMAL LOW (ref 3.87–5.11)

## 2013-08-16 NOTE — Patient Instructions (Addendum)
20 Stephanie Beard  08/16/2013   Your procedure is scheduled on:   08-22-2013  Report to [facility] at      0530  AM.  Call this number if you have problems the morning of surgery: [phone]  Or Presurgical Testing [phone](Nasim Garofano)      Do not eat food:After Midnight.    Take these medicines the morning of surgery with A SIP OF WATER: none.   Do not wear jewelry, make-up or nail polish.  Do not wear lotions, powders, or perfumes. You may wear deodorant.  Do not shave 12 hours prior to first CHG shower(legs and under arms).(face and neck okay.)  Do not bring valuables to the hospital.  Contacts, dentures or removable bridgework, body piercing, hair pins may not be worn into surgery.  Leave suitcase in the car. After surgery it may be brought to your room.  For patients admitted to the hospital, checkout time is 11:00 AM the day of discharge.   Patients discharged the day of surgery will not be allowed to drive home. Must have responsible person with you x 24 hours once discharged.  Name and phone number of your driver: Rick-spouse [phone] cell  Special Instructions: CHG(Chlorhedine 4%-"Hibiclens","Betasept","Aplicare") Shower Use Special Wash: see special instructions.(avoid face and genitals)   Please read over the following fact sheets that you were given:  Incentive Spirometry Instruction.    Failure to follow these instructions may result in Cancellation of your surgery.   Patient signature_______________________________________________________

## 2013-08-16 NOTE — Progress Notes (Signed)
08-16-13 Labs viewable in [facility]. Please note results of CXR.

## 2013-08-16 NOTE — Pre-Procedure Instructions (Addendum)
08-16-13 EKG/ CXR done today. 08-16-13 1655 labs and CXR results viewable in [facility] and faxed -please note.

## 2013-08-19 ENCOUNTER — Other Ambulatory Visit: Payer: Self-pay | Admitting: Orthopedic Surgery

## 2013-08-22 ENCOUNTER — Encounter (HOSPITAL_COMMUNITY)
Admission: RE | Disposition: A | Discharge status: Home / Self Care | Payer: Self-pay | Source: Ambulatory Visit | Attending: Specialist

## 2013-08-22 ENCOUNTER — Encounter (HOSPITAL_COMMUNITY): Payer: 59 | Admitting: Anesthesiology

## 2013-08-22 ENCOUNTER — Ambulatory Visit (HOSPITAL_COMMUNITY)
Admission: RE | Admit: 2013-08-22 | Discharge: 2013-08-22 | Disposition: A | Discharge status: Home / Self Care | Payer: 59 | Source: Ambulatory Visit | Attending: Specialist | Admitting: Specialist

## 2013-08-22 ENCOUNTER — Ambulatory Visit (HOSPITAL_COMMUNITY): Payer: 59 | Admitting: Anesthesiology

## 2013-08-22 ENCOUNTER — Ambulatory Visit (HOSPITAL_COMMUNITY): Payer: 59

## 2013-08-22 ENCOUNTER — Encounter (HOSPITAL_COMMUNITY): Payer: Self-pay | Admitting: *Deleted

## 2013-08-22 DIAGNOSIS — IMO0002 Reserved for concepts with insufficient information to code with codable children: Secondary | ICD-10-CM | POA: Insufficient documentation

## 2013-08-22 DIAGNOSIS — M2240 Chondromalacia patellae, unspecified knee: Secondary | ICD-10-CM | POA: Insufficient documentation

## 2013-08-22 DIAGNOSIS — M1710 Unilateral primary osteoarthritis, unspecified knee: Secondary | ICD-10-CM | POA: Insufficient documentation

## 2013-08-22 DIAGNOSIS — S83241A Other tear of medial meniscus, current injury, right knee, initial encounter: Secondary | ICD-10-CM

## 2013-08-22 DIAGNOSIS — X58XXXA Exposure to other specified factors, initial encounter: Secondary | ICD-10-CM | POA: Insufficient documentation

## 2013-08-22 DIAGNOSIS — M1711 Unilateral primary osteoarthritis, right knee: Secondary | ICD-10-CM

## 2013-08-22 DIAGNOSIS — I10 Essential (primary) hypertension: Secondary | ICD-10-CM | POA: Insufficient documentation

## 2013-08-22 HISTORY — PX: KNEE ARTHROSCOPY: SHX127

## 2013-08-22 SURGERY — ARTHROSCOPY, KNEE
Anesthesia: General | Site: Knee | Laterality: Right

## 2013-08-22 MED ORDER — LACTATED RINGERS IV SOLN
INTRAVENOUS | Status: DC
  Administered 2013-08-22 (×2): via INTRAVENOUS

## 2013-08-22 MED ORDER — BUPIVACAINE-EPINEPHRINE 0.5% -1:200000 IJ SOLN
INTRAMUSCULAR | Status: AC
  Filled 2013-08-22: qty 1

## 2013-08-22 MED ORDER — ONDANSETRON HCL 4 MG/2ML IJ SOLN
INTRAMUSCULAR | Status: AC
  Filled 2013-08-22: qty 2

## 2013-08-22 MED ORDER — BUPIVACAINE-EPINEPHRINE 0.5% -1:200000 IJ SOLN
INTRAMUSCULAR | Status: DC | PRN
  Administered 2013-08-22: 20 mL

## 2013-08-22 MED ORDER — CEFAZOLIN SODIUM-DEXTROSE 2-3 GM-% IV SOLR
INTRAVENOUS | Status: AC
  Filled 2013-08-22: qty 50

## 2013-08-22 MED ORDER — ASPIRIN EC 81 MG PO TBEC: 325.0000 mg | DELAYED_RELEASE_TABLET | Freq: Every day | ORAL | Status: DC

## 2013-08-22 MED ORDER — PROPOFOL 10 MG/ML IV BOLUS
INTRAVENOUS | Status: DC | PRN
  Administered 2013-08-22: 100 mg via INTRAVENOUS

## 2013-08-22 MED ORDER — METOCLOPRAMIDE HCL 5 MG/ML IJ SOLN
INTRAMUSCULAR | Status: AC
  Filled 2013-08-22: qty 2

## 2013-08-22 MED ORDER — PROMETHAZINE HCL 25 MG/ML IJ SOLN: 6.2500 mg | INTRAMUSCULAR | Status: DC | PRN

## 2013-08-22 MED ORDER — EPINEPHRINE HCL 1 MG/ML IJ SOLN
INTRAMUSCULAR | Status: AC
  Filled 2013-08-22: qty 2

## 2013-08-22 MED ORDER — GLYCOPYRROLATE 0.2 MG/ML IJ SOLN
INTRAMUSCULAR | Status: DC | PRN
  Administered 2013-08-22 (×3): .02 mg via INTRAVENOUS

## 2013-08-22 MED ORDER — GLYCOPYRROLATE 0.2 MG/ML IJ SOLN
INTRAMUSCULAR | Status: AC
  Filled 2013-08-22: qty 1

## 2013-08-22 MED ORDER — KETOROLAC TROMETHAMINE 30 MG/ML IJ SOLN
INTRAMUSCULAR | Status: AC
  Filled 2013-08-22: qty 1

## 2013-08-22 MED ORDER — CHLORHEXIDINE GLUCONATE 4 % EX LIQD: 60.0000 mL | Freq: Once | CUTANEOUS | Status: DC

## 2013-08-22 MED ORDER — EPINEPHRINE HCL 1 MG/ML IJ SOLN
INTRAMUSCULAR | Status: DC | PRN
  Administered 2013-08-22: 2 mg

## 2013-08-22 MED ORDER — DEXAMETHASONE SODIUM PHOSPHATE 10 MG/ML IJ SOLN
INTRAMUSCULAR | Status: AC
  Filled 2013-08-22: qty 1

## 2013-08-22 MED ORDER — ONDANSETRON HCL 4 MG/2ML IJ SOLN
INTRAMUSCULAR | Status: DC | PRN
  Administered 2013-08-22: 4 mg via INTRAVENOUS

## 2013-08-22 MED ORDER — KETAMINE HCL 10 MG/ML IJ SOLN
INTRAMUSCULAR | Status: DC | PRN
  Administered 2013-08-22: 100 mg via INTRAVENOUS

## 2013-08-22 MED ORDER — MIDAZOLAM HCL 2 MG/2ML IJ SOLN
INTRAMUSCULAR | Status: AC
  Filled 2013-08-22: qty 2

## 2013-08-22 MED ORDER — KETOROLAC TROMETHAMINE 30 MG/ML IJ SOLN
15.0000 mg | Freq: Once | INTRAMUSCULAR | Status: AC | PRN
  Administered 2013-08-22: 30 mg via INTRAVENOUS

## 2013-08-22 MED ORDER — FENTANYL CITRATE 0.05 MG/ML IJ SOLN: 25.0000 ug | INTRAMUSCULAR | Status: DC | PRN

## 2013-08-22 MED ORDER — HYDROCODONE-ACETAMINOPHEN 5-325 MG PO TABS: 1.0000 | ORAL_TABLET | Freq: Four times a day (QID) | ORAL | Status: DC | PRN

## 2013-08-22 MED ORDER — PROPOFOL 10 MG/ML IV BOLUS
INTRAVENOUS | Status: AC
  Filled 2013-08-22: qty 20

## 2013-08-22 MED ORDER — MIDAZOLAM HCL 5 MG/5ML IJ SOLN
INTRAMUSCULAR | Status: DC | PRN
  Administered 2013-08-22: 2 mg via INTRAVENOUS

## 2013-08-22 MED ORDER — KETAMINE HCL 50 MG/ML IJ SOLN
INTRAMUSCULAR | Status: AC
  Filled 2013-08-22: qty 10

## 2013-08-22 MED ORDER — DEXAMETHASONE SODIUM PHOSPHATE 4 MG/ML IJ SOLN
INTRAMUSCULAR | Status: DC | PRN
  Administered 2013-08-22: 10 mg via INTRAVENOUS

## 2013-08-22 MED ORDER — CEFAZOLIN SODIUM-DEXTROSE 2-3 GM-% IV SOLR
2.0000 g | INTRAVENOUS | Status: AC
  Administered 2013-08-22: 2 g via INTRAVENOUS

## 2013-08-22 MED ORDER — LACTATED RINGERS IR SOLN
Status: DC | PRN
  Administered 2013-08-22: 3000 mL

## 2013-08-22 MED ORDER — METOCLOPRAMIDE HCL 5 MG/ML IJ SOLN
INTRAMUSCULAR | Status: DC | PRN
  Administered 2013-08-22: 10 mg via INTRAVENOUS

## 2013-08-22 SURGICAL SUPPLY — 19 items
BANDAGE ELASTIC 6 VELCRO ST LF (GAUZE/BANDAGES/DRESSINGS) ×2 IMPLANT
BLADE 4.2CUDA (BLADE) ×2 IMPLANT
BLADE CUDA SHAVER 3.5 (BLADE) ×2 IMPLANT
CLOTH 2% CHLOROHEXIDINE 3PK (PERSONAL CARE ITEMS) ×2 IMPLANT
DRSG EMULSION OIL 3X3 NADH (GAUZE/BANDAGES/DRESSINGS) ×2 IMPLANT
DURAPREP 26ML APPLICATOR (WOUND CARE) ×2 IMPLANT
GLOVE BIOGEL PI IND STRL 8 (GLOVE) ×1 IMPLANT
GLOVE BIOGEL PI INDICATOR 8 (GLOVE) ×1
GLOVE SURG SS PI 8.0 STRL IVOR (GLOVE) ×4 IMPLANT
GOWN PREVENTION PLUS XXLARGE (GOWN DISPOSABLE) ×2 IMPLANT
GOWN STRL REIN XL XLG (GOWN DISPOSABLE) ×6 IMPLANT
MANIFOLD NEPTUNE II (INSTRUMENTS) ×2 IMPLANT
PACK ARTHROSCOPY WL (CUSTOM PROCEDURE TRAY) ×2 IMPLANT
PADDING CAST COTTON 6X4 STRL (CAST SUPPLIES) ×2 IMPLANT
SET ARTHROSCOPY TUBING (MISCELLANEOUS) ×1
SET ARTHROSCOPY TUBING LN (MISCELLANEOUS) ×1 IMPLANT
SPONGE GAUZE 4X4 12PLY (GAUZE/BANDAGES/DRESSINGS) ×2 IMPLANT
SUT ETHILON 4 0 PS 2 18 (SUTURE) ×2 IMPLANT
WRAP KNEE MAXI GEL POST OP (GAUZE/BANDAGES/DRESSINGS) ×2 IMPLANT

## 2013-08-22 NOTE — Interval H&P Note (Signed)
History and Physical Interval Note:  08/22/2013 7:33 AM  Stephanie Beard  has presented today for surgery, with the diagnosis of right knee medial mensical tear and DJD  The various methods of treatment have been discussed with the patient and family. After consideration of risks, benefits and other options for treatment, the patient has consented to  Procedure(s): RIGHT KNEE ARTHROSCOPY WITH PARTIAL MEDIAL MENISECTOMY/DEBRIDEMENT/POSSIBLE MICROFRACTURE AND DRILLING (Right) as a surgical intervention .  The patient's history has been reviewed, patient examined, no change in status, stable for surgery.  I have reviewed the patient's chart and labs.  Questions were answered to the patient's satisfaction.     Aemon Koeller C

## 2013-08-22 NOTE — Anesthesia Postprocedure Evaluation (Signed)
  Anesthesia Post-op Note  Patient: Stephanie Beard  Procedure(s) Performed: Procedure(s) (LRB): RIGHT KNEE ARTHROSCOPY WITH PARTIAL MEDIAL and LATERAL MENISECTOMY/DEBRIDEMENT (Right)  Patient Location: PACU  Anesthesia Type: General  Level of Consciousness: awake and alert   Airway and Oxygen Therapy: Patient Spontanous Breathing  Post-op Pain: mild  Post-op Assessment: Post-op Vital signs reviewed, Patient's Cardiovascular Status Stable, Respiratory Function Stable, Patent Airway and No signs of Nausea or vomiting  Last Vitals:  Filed Vitals:   08/22/13 0913  BP: 146/71  Pulse: 88  Temp: 36.5 C  Resp: 20    Post-op Vital Signs: stable   Complications: No apparent anesthesia complications

## 2013-08-22 NOTE — Brief Op Note (Signed)
08/22/2013  8:27 AM  PATIENT:  Stephanie Beard  57 y.o. 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  PROCEDURE:  Procedure(s): RIGHT KNEE ARTHROSCOPY WITH PARTIAL MEDIAL MENISECTOMY/DEBRIDEMENT/POSSIBLE MICROFRACTURE AND DRILLING (Right)  SURGEON:  Surgeon(s) and Role:    * Javier Docker, MD - Primary  PHYSICIAN ASSISTANT:   ASSISTANTS: Bissell   ANESTHESIA:   general  EBL:  Total I/O In: 1000 [I.V.:1000] Out: -   BLOOD ADMINISTERED:none  DRAINS: none   LOCAL MEDICATIONS USED:  MARCAINE     SPECIMEN:  No Specimen  DISPOSITION OF SPECIMEN:  N/A  COUNTS:  YES  TOURNIQUET:  * No tourniquets in log *  DICTATION: .Other Dictation: Dictation Number [id number]  PLAN OF CARE: Discharge to home after PACU  PATIENT DISPOSITION:  PACU - hemodynamically stable.   Delay start of Pharmacological VTE agent (>24hrs) due to surgical blood loss or risk of bleeding: yes

## 2013-08-22 NOTE — H&P (View-Only) (Signed)
Stephanie Beard is an 57 y.o. female.   Chief Complaint: right knee pain HPI: The patient is a 57 year old female being followed for their right knee pain. They are now 3 month(s) out from when symptoms began (after running). Symptoms reported today include: pain, swelling (mild), stiffness and instability, while the patient does not report symptoms of: giving way. Current treatment includes: home exercise program, relative rest, activity modification and Aleve. The following medication has been used for pain control: antiinflammatory medication. The patient has reported symptom improvement with: Cortisone injections (short term relief) and NSAIDs while they have not gotten any relief of their symptoms with: bracing. Stephanie Beard follows up today for her right knee. She reports she has actually had less buckling since her last visit although it still feels unstable. The last few days steps have been easier to her to do but she still has pain when pivoting. She does sit at work. She is taking NSAIDs prn. She denies any other injury to cause her pain.  Past Medical History  Diagnosis Date  . Meningioma 04/2013    brain MRI and treatment with radiation  . Hypertension 2012    Past Surgical History  Procedure Laterality Date  . Wisdom tooth extraction  1978  . Tubal ligation Bilateral 1992  . Vaginal hysterectomy  1993    secondary prolapse w/A&P repair  . Anterior and posterior vaginal repair  1993    along with TVH  . Cesarean section  1990    Family History  Problem Relation Age of Onset  . Hypertension Mother   . Hypercholesterolemia Mother   . Heart disease Mother   . Heart disease Father   . Hypertension Father   . Hypertension Brother   . Hyperlipidemia Brother    Social History:  reports that she has never smoked. She has never used smokeless tobacco. She reports that she drinks alcohol. She reports that she does not use illicit drugs.  Allergies: No Known Allergies   (Not in a  hospital admission)  No results found for this or any previous visit (from the past 48 hour(s)). No results found.  Review of Systems  Constitutional: Negative.   HENT: Negative.   Eyes: Negative.   Respiratory: Negative.   Cardiovascular: Negative.   Gastrointestinal: Negative.   Genitourinary: Negative.   Musculoskeletal: Positive for joint pain.  Skin: Negative.   Neurological: Negative.   Psychiatric/Behavioral: Negative.     There were no vitals taken for this visit. Physical Exam  Constitutional: She is oriented to person, place, and time. She appears well-developed and well-nourished.  HENT:  Head: Normocephalic and atraumatic.  Eyes: Conjunctivae and EOM are normal. Pupils are equal, round, and reactive to light.  Neck: Normal range of motion. Neck supple.  Cardiovascular: Normal rate and regular rhythm.   Respiratory: Effort normal and breath sounds normal.  GI: Soft. Bowel sounds are normal.  Musculoskeletal:  General Mental Status - Alert. General Appearance- pleasant. Not in acute distress. Orientation- Oriented X3. Build & Nutrition- Well nourished and Well developed. Gait- Limping.  Musculoskeletal Lower Extremity Right Lower Extremity: Right Knee: Inspection and Palpation:Tenderness- medial joint line tender to palpation. no tenderness to palpation of the posterior knee, no tenderness to palpation of the superior calf, no tenderness to palpation of the pes anserine bursa, no tenderness to palpation of the quadriceps tendon, no tenderness to palpation of the patellar tendon, no tenderness to palpation of the patella, no tenderness to palpation of the lateral joint line,  no tenderness to palpation of the fibular head and no tenderness to palpation of the peroneal nerve. Effusion- trace. Tissue tension/texture is - soft. Crepitus- moderate patellofemoral crepitus. Pulses- 2+. Sensation- intact to light touch. Skin- Color- no ecchymosis and no  erythema. Strength and Tone:Quadriceps- 5/5. Hamstrings- 5/5. ROM: Flexion:AROM- 120 . Extension:AROM- 0 . Stability- Valgus Laxity at 30- None. Valgus Laxity at 0- None. Varus Laxity at 30- None. Varus Laxity at 0- None. Lachman- Negative. Anterior Drawer Test - Negative. Posterior Drawer Test- Negative. Deformities/Malalignments/Discrepancies- no deformities noted. Special Tests:McMurray Test (lateral) - negative. McMurray Test (medial)- painful. Patellar Compression Pain- mild pain.  Neurological: She is alert and oriented to person, place, and time. She has normal reflexes.  Skin: Skin is warm and dry.  Psychiatric: She has a normal mood and affect.    prior standing xrays reviewed by Dr. Shelle Iron with moderate tricompartmental degenerative changes, slightly worse right than left medial joint space.  MRI R knee images and report reviewed by Dr. Shelle Iron with MMT, posterior horn near the root with peripheral extrusion, as well as a tear in the body as well. Partial thickness cartilage loss patella, full thickness cartilage loss medial femoral condyle and medial tibial plateau with stress rection change in the bone as well consistent with bone bruising.  Assessment/Plan Right knee MMT, DJD  I had a long discussion with the patient concerning the risks and benefits of knee arthroscopy including help from the arthroscopic procedure as well as no help from the arthroscopic procedure or worsening of symptoms. Also discussed infection, DVT, PE, anesthetic complications, etc. Also discussed the possibility of repeat arthroscopic surgery required in the future or total knee replacement. I provided the patient with an illustrated handout and discussed it in detail as well as discussed the postoperative and perioperative courses and return to functional activities including work. Need for postoperative DVT prophylaxis was discussed as well.  Pt with R knee pain, swelling, mechanical  symptoms due to MMT, underlying DJD. We discussed her dx and results in detail, reviewed relevant anatomy. Refractory to steroid injection, bracing, relative rest, activity modification, quad strengthening, NSAIDs, ice and elevation. Given her ongoing symptoms especially mechanical in nature, it is reasonable to proceed with surgery at this point as we previously discussed, which would be R knee arthroscopy, partial medial meniscectomy, debridement, and possible microfracture/drilling. We discussed the procedure itself in detail as well as risks, complications, and alternatives including but not limited to DVT, PE, infx, bleeding, failure of procedure, need for secondary procedure, anesthesia risk, even death. Discussed post-op protocols, expected outcome, time out of work, importance of elevation and activity modifications, possible need for limited weightbearing if we do proceed with microfracture on a weightbearing surface, possible post-op stiffness, need for repeat steroid and/or viscosupplementation injections. All questions were answered and she desires to proceed. We will proceed accordingly and get her scheduled. No DVT or MRSA hx. We did discuss gradual increase of activity post-op, from biking to elliptical to running on a rubberized track and as low impact as possible to prevent exacerbation. She will follow up 10-14 days post-op for suture removal and call with any questions or concerns in the interim.  plan right knee arthroscopy, partial medial meniscectomy, debridement, possible microfracture/drilling  BISSELL, JACLYN M. for Dr. Shelle Iron 08/05/2013, 8:08 AM

## 2013-08-22 NOTE — Transfer of Care (Signed)
Immediate Anesthesia Transfer of Care Note  Patient: Stephanie Beard  Procedure(s) Performed: Procedure(s): RIGHT KNEE ARTHROSCOPY WITH PARTIAL MEDIAL and LATERAL MENISECTOMY/DEBRIDEMENT (Right)  Patient Location: PACU  Anesthesia Type:General  Level of Consciousness: Patient easily awoken, sedated, comfortable, cooperative, following commands, responds to stimulation.   Airway & Oxygen Therapy: Patient spontaneously breathing, ventilating well, oxygen via simple oxygen mask.  Post-op Assessment: Report given to PACU RN, vital signs reviewed and stable, moving all extremities.   Post vital signs: Reviewed and stable.  Complications: No apparent anesthesia complications

## 2013-08-22 NOTE — Preoperative (Signed)
Beta Blockers   Reason not to administer Beta Blockers:Not Applicable, not on home BB 

## 2013-08-22 NOTE — Anesthesia Preprocedure Evaluation (Signed)
Anesthesia Evaluation  Patient identified by MRN, date of birth, ID band Patient awake    Reviewed: Allergy & Precautions, H&P , NPO status , Patient's Chart, lab work & pertinent test results  Airway Mallampati: II TM Distance: >3 FB Neck ROM: Full    Dental no notable dental hx.    Pulmonary neg pulmonary ROS,  breath sounds clear to auscultation  Pulmonary exam normal       Cardiovascular hypertension, Pt. on medications Rhythm:Regular Rate:Normal     Neuro/Psych negative neurological ROS  negative psych ROS   GI/Hepatic negative GI ROS, Neg liver ROS,   Endo/Other  negative endocrine ROS  Renal/GU negative Renal ROS  negative genitourinary   Musculoskeletal negative musculoskeletal ROS (+)   Abdominal   Peds negative pediatric ROS (+)  Hematology negative hematology ROS (+)   Anesthesia Other Findings   Reproductive/Obstetrics negative OB ROS                           Anesthesia Physical Anesthesia Plan  ASA: II  Anesthesia Plan: General   Post-op Pain Management:    Induction: Intravenous  Airway Management Planned: LMA  Additional Equipment:   Intra-op Plan:   Post-operative Plan:   Informed Consent: I have reviewed the patients History and Physical, chart, labs and discussed the procedure including the risks, benefits and alternatives for the proposed anesthesia with the patient or authorized representative who has indicated his/her understanding and acceptance.   Dental advisory given  Plan Discussed with: CRNA and Surgeon  Anesthesia Plan Comments:         Anesthesia Quick Evaluation  

## 2013-08-23 ENCOUNTER — Encounter (HOSPITAL_COMMUNITY): Payer: Self-pay | Admitting: Specialist

## 2013-08-23 NOTE — Op Note (Signed)
Stephanie Beard, Stephanie Beard NO.:  [id number]  MEDICAL RECORD NO.:  [id number]  LOCATION:  [facility]                         FACILITY:  [facility]  PHYSICIAN:  Jene Every, M.D.    DATE OF BIRTH:  11/24/1955  DATE OF PROCEDURE:  08/22/2013 DATE OF DISCHARGE:  08/22/2013                              OPERATIVE REPORT   PREOPERATIVE DIAGNOSES:  Degenerative joint disease, medial meniscus tear of the right knee.  PREOPERATIVE DIAGNOSES:  Degenerative joint disease, medial meniscus tear of the right knee, lateral meniscus tear, grade 3 chondromalacia of patella, grade 3 chondromalacia of the medial femoral condyle and medial tibial plateau.  Very small near grade 4 lesion beneath the medial meniscus, grade 2 changes of the lateral compartment.  PROCEDURES PERFORMED: 1. Right knee arthroscopy. 2. Partial medial and lateral meniscectomy. 3. Chondroplasties of the medial femoral condyle, tibial plateau.     Light chondroplasty of medial femoral condyle, tibial plateau, and     patella.  HISTORY:  This is a 57 year old female with mechanical symptoms of DJD, severe arthrosis noted in predominantly patellofemoral and the medial compartment had a tear of the meniscus and mechanical symptoms refractory to conservative treatment including exercise, activity modification, and injections, indicated for arthroscopic debridement. Risks and benefits were discussed including bleeding, infection, no change in symptoms, worsening symptoms, need for repeat debridement, DVT, PE, anesthetic complications, etc.  TECHNIQUE:  With the patient in supine position after induction of adequate anesthesia, 2 g of Kefzol, the right lower extremity was prepped and draped in usual sterile fashion.  A lateral parapatellar portal was fashioned with a #11 blade, taking into account some extensive varicosities in the region.  Ingress cannula was atraumatically placed.  Irrigant was utilized to insufflate the  joint. Under direct visualization, a medial parapatellar portal was fashioned with a #11 blade after localization with 18-gauge needle sparing the medial meniscus.  Noted was a loose body that was in the medial compartment, this was retrieved with 3.5 shaver.  We initially had a 4-5 and then converted to a 3-5 to facilitate the procedure to the back of the joint space.  There was tearing along the entire meniscus and into the posterior horn.  We introduced a basket and resected one-third of the medial third of the meniscus from anteriorly to posteriorly and further contoured it with 3.5 Cuda shaver.  Very light chondroplasty was performed of the femoral condyle and tibial plateau where there was extensive grade 3 changes, appeared to be a near grade 4 change in the midportion of the tibia at the junction of the anterior and posterior half.  ACL was unremarkable.  Lateral compartment revealed radial tear.  Lateral meniscus, minor grade 2 and grade 3 changes.  Joint was debrided and we performed a partial lateral meniscectomy to a stable base.  Approximately 20% of the mid inner third was excised.  The remnant was stable to probe palpation.  Suprapatellar pouch revealed extensive grade 3 changes of the patella. Light chondroplasty performed here.  Sulcus was relatively spared. There was normal patellofemoral tracking.  Gutters were unremarkable.  I revisited all compartments, crossed the popliteal space, copiously lavaged the knee, removed all instrumentation as  there was no further pathology amenable to our arthroscopic intervention.  I, therefore, closed the portals with 4-0 nylon simple sutures, 0.25% Marcaine with epinephrine was infiltrated in the joint.  Wound was dressed sterilely, woven without difficulty and transported to the recovery room in satisfactory condition.  The patient tolerated the procedure well.  No complications.  Lanna Poche, PA was the assistant.  As  the joint was fairly tight and required second assist to open up the joint space medially and laterally by manipulation.     Jene Every, M.D.     Stephanie Beard  D:  08/22/2013  T:  08/23/2013  Job:  [id number]

## 2013-09-25 ENCOUNTER — Other Ambulatory Visit: Payer: Self-pay | Admitting: Radiation Therapy

## 2013-09-25 DIAGNOSIS — D320 Benign neoplasm of cerebral meninges: Secondary | ICD-10-CM

## 2013-11-11 ENCOUNTER — Ambulatory Visit
Admission: RE | Admit: 2013-11-11 | Discharge: 2013-11-11 | Disposition: A | Discharge status: Home / Self Care | Payer: 59 | Source: Ambulatory Visit | Attending: Radiation Oncology | Admitting: Radiation Oncology

## 2013-11-11 ENCOUNTER — Other Ambulatory Visit: Payer: Self-pay | Admitting: Radiation Therapy

## 2013-11-11 DIAGNOSIS — D320 Benign neoplasm of cerebral meninges: Secondary | ICD-10-CM

## 2013-11-11 LAB — BUN AND CREATININE (CC13)
BUN: 15.3 mg/dL (ref 7.0–26.0)
Creatinine: 0.7 mg/dL (ref 0.6–1.1)

## 2013-11-14 ENCOUNTER — Other Ambulatory Visit: Payer: 59

## 2013-11-14 ENCOUNTER — Ambulatory Visit (HOSPITAL_COMMUNITY)
Admission: RE | Admit: 2013-11-14 | Discharge: 2013-11-14 | Disposition: A | Discharge status: Home / Self Care | Payer: 59 | Source: Ambulatory Visit | Attending: Radiation Oncology | Admitting: Radiation Oncology

## 2013-11-14 DIAGNOSIS — D320 Benign neoplasm of cerebral meninges: Secondary | ICD-10-CM | POA: Insufficient documentation

## 2013-11-14 MED ORDER — GADOBENATE DIMEGLUMINE 529 MG/ML IV SOLN
15.0000 mL | Freq: Once | INTRAVENOUS | Status: AC
  Administered 2013-11-14: 13 mL via INTRAVENOUS

## 2013-11-18 ENCOUNTER — Encounter: Payer: Self-pay | Admitting: Radiation Oncology

## 2013-11-18 ENCOUNTER — Ambulatory Visit
Admission: RE | Admit: 2013-11-18 | Discharge: 2013-11-18 | Disposition: A | Discharge status: Home / Self Care | Payer: 59 | Source: Ambulatory Visit | Attending: Radiation Oncology | Admitting: Radiation Oncology

## 2013-11-18 VITALS — BP 139/82 | HR 63 | Temp 98.0°F | Resp 16

## 2013-11-18 DIAGNOSIS — D329 Benign neoplasm of meninges, unspecified: Secondary | ICD-10-CM

## 2013-11-18 NOTE — Progress Notes (Signed)
Radiation Oncology         [phone]) [phone] ________________________________  Name: Stephanie Beard MRN: [id number]  Date: 11/18/2013  DOB: [birth year]  Follow-Up Visit Note  CC: Stephanie Heck, MD  Stephanie Beard*  Diagnosis:   58 year old woman s/p fractionated stereotactic radiotherapy to a right sphenoid wing 2.6 x 1.9 x 1.6 cm meningioma invading the sella encasing the right optic nerve and deviating the chiasm 05/09/2013-06/19/2013 to 52.2 Gy in 29 fractions of 1.8 Gy   Interval Since Last Radiation:  5  months  Narrative:  The patient returns today for routine follow-up.  The recent films were presented in our multidisciplinary conference with neuroradiology just prior to the clinic.  She remains asymptomatic. She denies any headaches or visual symptoms. She denies any hair loss.                              ALLERGIES:  has No Known Allergies.  Meds: Current Outpatient Prescriptions  Medication Sig Dispense Refill  . Ascorbic Acid (VITAMIN C) 1000 MG tablet Take 1,000 mg by mouth daily.      Marland Kitchen aspirin EC 81 MG tablet Take 4 tablets (325 mg total) by mouth daily.      . calcium-vitamin D 250-100 MG-UNIT per tablet Take 1 tablet by mouth daily.       . hydrochlorothiazide (HYDRODIURIL) 25 MG tablet Take 25 mg by mouth every morning.       Marland Kitchen ibuprofen (ADVIL,MOTRIN) 200 MG tablet Take 200 mg by mouth every 6 (six) hours as needed for mild pain or moderate pain.       . Multiple Vitamin (MULTIVITAMIN WITH MINERALS) TABS tablet Take 1 tablet by mouth daily.      Marland Kitchen estradiol (ESTRACE) 0.5 MG tablet Take 0.5 mg by mouth at bedtime.      Marland Kitchen HYDROcodone-acetaminophen (NORCO) 5-325 MG per tablet Take 1-2 tablets by mouth every 6 (six) hours as needed for moderate pain.  40 tablet  0  . naproxen sodium (ANAPROX) 220 MG tablet Take 220 mg by mouth 2 (two) times daily as needed (for pain).       No current facility-administered medications for this encounter.    Physical  Findings: The patient is in no acute distress. Patient is alert and oriented.  oral temperature is 98 F (36.7 C). Her blood pressure is 139/82 and her pulse is 63. Her respiration is 16 and oxygen saturation is 100%. .  No significant changes.  Lab Findings: Lab Results  Component Value Date   WBC 5.0 08/16/2013   HGB 12.6 08/16/2013   HCT 37.0 08/16/2013   MCV 97.9 08/16/2013   PLT 288 08/16/2013    @LASTCHEM @  Radiographic Findings: Stephanie Beard Contrast  11/14/2013   CLINICAL DATA:  58 year old female status post fractionated stereotactic radiotherapy to a right skullbase meningioma invading the sella turcica and involving the right optic nerve. Restaging. Subsequent encounter.  EXAM: MRI HEAD WITHOUT AND WITH CONTRAST  TECHNIQUE: Multiplanar, multiecho pulse sequences of the brain and surrounding structures were obtained without and with intravenous contrast.  CONTRAST:  55mL MULTIHANCE GADOBENATE DIMEGLUMINE 529 MG/ML IV SOLN  COMPARISON:  08/14/2013 and earlier.  FINDINGS: Homogeneously enhancing right cavernous sinus and interest sella meningioma appears stable in size and configuration. Axial post-contrast images today are angled somewhat differently from the prior study. Involvement of the cavernous sinus, sella, and right orbital apex are stable. As before, the  lesion extends to but not through the right foramen of bowel and foramen rotundum. No abnormal fifth nerve enhancement.  No other abnormal intracranial enhancement.  Major intracranial vascular flow voids are stable, including the right ICA siphon.  No restricted diffusion or evidence of acute infarction. No acute intracranial hemorrhage identified. No midline shift. No ventriculomegaly. Stable scattered mostly subcortical cerebral white matter T2 and FLAIR hyperintensity. Negative cervicomedullary junction visualized cervical spine. Stable and normal bone marrow signal except for the chronic changes at the level of the meningioma.   Visualized orbit soft tissues are within normal limits. Trace left maxillary sinus fluid level. Other Visualized paranasal sinuses and mastoids are clear. Visible internal auditory structures appear normal. Visualized scalp soft tissues are within normal limits.  IMPRESSION: 1. Unchanged appearance of the right cavernous sinus/skullbase meningioma. 2. No new intracranial abnormality.   Electronically Signed   By: Stephanie Beard M.D.   On: 11/14/2013 18:59    Impression:  The patient remains stable with no evidence of meningioma growth.  Plan:  MRI in 6 months and followup  _____________________________________  Sheral Apley. [name], M.D.

## 2013-11-18 NOTE — Progress Notes (Signed)
Reports that she rarely has headaches now. Reports if she does she takes Motrin but, hasn't had to in several weeks. Reports vision has improved. Denies diminished peripheral vision or diplopia. Reports floaters continue. Denies nausea, vomiting, or dizziness. Refused weight.

## 2013-12-19 ENCOUNTER — Other Ambulatory Visit: Payer: Self-pay | Admitting: Radiation Therapy

## 2013-12-19 DIAGNOSIS — D320 Benign neoplasm of cerebral meninges: Secondary | ICD-10-CM

## 2014-05-20 ENCOUNTER — Other Ambulatory Visit: Payer: Self-pay | Admitting: Radiation Therapy

## 2014-05-20 DIAGNOSIS — D320 Benign neoplasm of cerebral meninges: Secondary | ICD-10-CM

## 2014-05-21 ENCOUNTER — Ambulatory Visit
Admission: RE | Admit: 2014-05-21 | Discharge: 2014-05-21 | Disposition: A | Discharge status: Home / Self Care | Payer: 59 | Source: Ambulatory Visit | Attending: Radiation Oncology | Admitting: Radiation Oncology

## 2014-05-21 DIAGNOSIS — D320 Benign neoplasm of cerebral meninges: Secondary | ICD-10-CM | POA: Diagnosis not present

## 2014-05-21 LAB — BUN AND CREATININE (CC13)
BUN: 10 mg/dL (ref 7.0–26.0)
Creatinine: 0.7 mg/dL (ref 0.6–1.1)

## 2014-05-23 ENCOUNTER — Ambulatory Visit (HOSPITAL_COMMUNITY)
Admission: RE | Admit: 2014-05-23 | Discharge: 2014-05-23 | Disposition: A | Discharge status: Home / Self Care | Payer: 59 | Source: Ambulatory Visit | Attending: Radiation Oncology | Admitting: Radiation Oncology

## 2014-05-23 DIAGNOSIS — D320 Benign neoplasm of cerebral meninges: Secondary | ICD-10-CM | POA: Insufficient documentation

## 2014-05-23 MED ORDER — GADOBENATE DIMEGLUMINE 529 MG/ML IV SOLN
12.0000 mL | Freq: Once | INTRAVENOUS | Status: AC | PRN
  Administered 2014-05-23: 12 mL via INTRAVENOUS

## 2014-05-23 MED ORDER — GADOBENATE DIMEGLUMINE 529 MG/ML IV SOLN: 8.0000 mL | Freq: Once | INTRAVENOUS | Status: AC | PRN

## 2014-05-25 ENCOUNTER — Encounter: Payer: Self-pay | Admitting: Radiation Oncology

## 2014-05-25 NOTE — Progress Notes (Signed)
Radiation Oncology         [phone]) [phone] ________________________________  Name: Stephanie Beard MRN: [id number]  Date: 05/26/2014  DOB: Jun 02, 1956    Multidisciplinary Brain and Spine Oncology Clinic Follow-Up Visit Note  CC: Stephanie Heck, MD  Stephanie Beard*  Diagnosis:   58 year old woman s/p fractionated stereotactic radiotherapy to a right sphenoid wing 2.6 x 1.9 x 1.6 cm meningioma invading the sella encasing the right optic nerve and deviating the chiasm 05/09/2013-06/19/2013 to 52.2 Gy in 29 fractions of 1.8 Gy   Interval Since Last Radiation:  11  months  Narrative:  The patient returns today for routine follow-up.  The recent films were presented in our multidisciplinary conference with neuroradiology just prior to the clinic.  She is working full time and asymptomatic.                              ALLERGIES:  has No Known Allergies.  Meds: Current Outpatient Prescriptions  Medication Sig Dispense Refill  . Ascorbic Acid (VITAMIN C) 1000 MG tablet Take 1,000 mg by mouth daily.      Marland Kitchen aspirin EC 81 MG tablet Take 4 tablets (325 mg total) by mouth daily.      . calcium-vitamin D 250-100 MG-UNIT per tablet Take 1 tablet by mouth daily.       Marland Kitchen estradiol (ESTRACE) 0.5 MG tablet Take 0.5 mg by mouth at bedtime.      . hydrochlorothiazide (HYDRODIURIL) 25 MG tablet Take 25 mg by mouth every morning.       Marland Kitchen HYDROcodone-acetaminophen (NORCO) 5-325 MG per tablet Take 1-2 tablets by mouth every 6 (six) hours as needed for moderate pain.  40 tablet  0  . ibuprofen (ADVIL,MOTRIN) 200 MG tablet Take 200 mg by mouth every 6 (six) hours as needed for mild pain or moderate pain.       . Multiple Vitamin (MULTIVITAMIN WITH MINERALS) TABS tablet Take 1 tablet by mouth daily.      . naproxen sodium (ANAPROX) 220 MG tablet Take 220 mg by mouth 2 (two) times daily as needed (for pain).       No current facility-administered medications for this encounter.    Physical  Findings: The patient is in no acute distress. Patient is alert and oriented.  No significant changes.  Lab Findings: Lab Results  Component Value Date   WBC 5.0 08/16/2013   HGB 12.6 08/16/2013   HCT 37.0 08/16/2013   MCV 97.9 08/16/2013   PLT 288 08/16/2013    @LASTCHEM @  Radiographic Findings: Mr Stephanie Beard [facility]U Contrast  05/23/2014   CLINICAL DATA:  Right cavernous sinus meningioma. Treated with fractionated stereotactic radio surgery.  EXAM: MRI HEAD WITHOUT AND WITH CONTRAST  TECHNIQUE: Multiplanar, multiecho pulse sequences of the brain and surrounding structures were obtained without and with intravenous contrast.  CONTRAST:  40mL MULTIHANCE GADOBENATE DIMEGLUMINE 529 MG/ML IV SOLN  COMPARISON:  MRI 11/14/2013  FINDINGS: Right cavernous sinus mass again noted and similar to the prior study. The mass shows homogeneous enhancement consistent with meningioma. There is encasement of the right cavernous carotid which is patent. Mass encases the optic nerve in the optic canal also unchanged. The mass extends into the sella displacing the pituitary and infundibulum to the left unchanged.  Overall the mass is unchanged in size. Coronal measurements are 22 mm in side-to-side and 19 mm in height. There is a dural tail extending into the tentorium  which shows thickening and enhancement unchanged from the prior study.  Ventricle size is normal. No shift of the midline structures. Negative for acute infarct. Small white matter hyperintensities bilaterally consistent with chronic microvascular ischemia. No other enhancing mass lesion. Negative for hemorrhage.  IMPRESSION: Right cavernous sinus meningioma is stable.  No new findings.   Electronically Signed   By: Stephanie Beard M.D.   On: 05/23/2014 17:56    Impression:  The patient has no evidence of disease.  In accordance with current NCCN guidelines (ref below), she will be due for MRI in 6 months.  Plan:  MRI in 6 months, then  follow-up.  _____________________________________  Stephanie Beard. Stephanie Beard, M.D.        MRI at 3, 6, and 12 months, then every 6-12 months for 5 y, then every 1-3 years

## 2014-05-26 ENCOUNTER — Ambulatory Visit
Admission: RE | Admit: 2014-05-26 | Discharge: 2014-05-26 | Disposition: A | Discharge status: Home / Self Care | Payer: 59 | Source: Ambulatory Visit | Attending: Radiation Oncology | Admitting: Radiation Oncology

## 2014-05-26 DIAGNOSIS — D329 Benign neoplasm of meninges, unspecified: Secondary | ICD-10-CM

## 2014-06-05 ENCOUNTER — Ambulatory Visit: Payer: 59 | Admitting: Nurse Practitioner

## 2014-06-30 ENCOUNTER — Encounter: Payer: Self-pay | Admitting: Nurse Practitioner

## 2014-06-30 ENCOUNTER — Ambulatory Visit (INDEPENDENT_AMBULATORY_CARE_PROVIDER_SITE_OTHER): Payer: 59 | Admitting: Nurse Practitioner

## 2014-06-30 VITALS — BP 120/82 | HR 64 | Ht 61.0 in | Wt 138.0 lb

## 2014-06-30 DIAGNOSIS — Z01419 Encounter for gynecological examination (general) (routine) without abnormal findings: Secondary | ICD-10-CM

## 2014-06-30 DIAGNOSIS — Z0000 Encounter for general adult medical examination without abnormal findings: Secondary | ICD-10-CM

## 2014-06-30 DIAGNOSIS — Z1211 Encounter for screening for malignant neoplasm of colon: Secondary | ICD-10-CM

## 2014-06-30 LAB — POCT URINALYSIS DIPSTICK
Bilirubin, UA: NEGATIVE
Glucose, UA: NEGATIVE
Ketones, UA: NEGATIVE
Leukocytes, UA: NEGATIVE
Nitrite, UA: NEGATIVE
PROTEIN UA: NEGATIVE
RBC UA: NEGATIVE
Urobilinogen, UA: NEGATIVE
pH, UA: 7

## 2014-06-30 NOTE — Progress Notes (Signed)
Patient ID: Stephanie Beard, female   DOB: 1956-02-15, 58 y.o.   MRN: [id number] 58 y.o. G108P2002 Married Caucasian Fe here for annual exam.  Has finished radiation a year ago for the meningioma.  Right knee scope in December.  Some vaginal dryness.  Still likes to hike and travel.  Patient's last menstrual period was 01/11/1992.          Sexually active: yes  The current method of family planning is status post hysterectomy.  Exercising: yes walking, running and yoga  Smoker: no   Health Maintenance:  Pap: 05/25/10, WNL  MMG: 06/24/13, BI-Rads 1: negative will schedule Colonoscopy: 03/02/09, normal 10 year recall IFOB is given BMD: 10/06/08 T Score: spine -0.6; left hip neck -1.3; right hip neck -1.7  TDaP: 06/05/08  Labs:  HB:  PCP  Urine:  Negative    reports that she has never smoked. She has never used smokeless tobacco. She reports that she drinks alcohol. She reports that she does not use illicit drugs.  Past Medical History  Diagnosis Date  . Meningioma 04/2013    brain MRI(hx. "double vision" and treatment with radiation-last tx. radiation 8'14(no further problems)  . Hypertension 2012  . Arthritis     Right knee osteoarthritis    Past Surgical History  Procedure Laterality Date  . Wisdom tooth extraction  1978  . Tubal ligation Bilateral 1992  . Vaginal hysterectomy  1993    secondary prolapse w/A&P repair  . Anterior and posterior vaginal repair  1993    along with TVH  . Cesarean section  1990  . Abdominal hysterectomy    . Knee arthroscopy Right 08/22/2013    Procedure: RIGHT KNEE ARTHROSCOPY WITH PARTIAL MEDIAL and LATERAL MENISECTOMY/DEBRIDEMENT;  Surgeon: Johnn Hai, MD;  Location: [facility];  Service: Orthopedics;  Laterality: Right;    Current Outpatient Prescriptions  Medication Sig Dispense Refill  . Ascorbic Acid (VITAMIN C) 1000 MG tablet Take 1,000 mg by mouth daily.      Marland Kitchen aspirin EC 81 MG tablet Take 4 tablets (325 mg total) by mouth daily.      .  calcium-vitamin D 250-100 MG-UNIT per tablet Take 1 tablet by mouth daily.       . Cyanocobalamin (VITAMIN B-12 PO) Take by mouth daily.      . hydrochlorothiazide (HYDRODIURIL) 25 MG tablet Take 25 mg by mouth every morning.       . Multiple Vitamin (MULTIVITAMIN WITH MINERALS) TABS tablet Take 1 tablet by mouth daily.      . Omega-3 Fatty Acids (FISH OIL PO) Take by mouth daily.       No current facility-administered medications for this visit.    Family History  Problem Relation Age of Onset  . Hypertension Mother   . Hypercholesterolemia Mother   . Heart disease Mother   . Heart disease Father   . Hypertension Father   . Hypertension Brother   . Hyperlipidemia Brother     ROS:  Pertinent items are noted in HPI.  Otherwise, a comprehensive ROS was negative.  Exam:   BP 120/82  Pulse 64  Ht 5\' 1"  (1.549 m)  Wt 138 lb (62.596 kg)  BMI 26.09 kg/m2  LMP 01/11/1992 Height: 5\' 1"  (154.9 cm)  Ht Readings from Last 3 Encounters:  06/30/14 5\' 1"  (1.549 m)  08/16/13 5\' 1"  (1.549 m)  06/04/13 5\' 1"  (1.549 m)    General appearance: alert, cooperative and appears stated age Head: Normocephalic, without obvious abnormality,  atraumatic Neck: no adenopathy, supple, symmetrical, trachea midline and thyroid normal to inspection and palpation Lungs: clear to auscultation bilaterally Breasts: normal appearance, no masses or tenderness Heart: regular rate and rhythm Abdomen: soft, non-tender; no masses,  no organomegaly Extremities: extremities normal, atraumatic, no cyanosis or edema Skin: Skin color, texture, turgor normal. No rashes or lesions Lymph nodes: Cervical, supraclavicular, and axillary nodes normal. No abnormal inguinal nodes palpated Neurologic: Grossly normal   Pelvic: External genitalia:  no lesions              Urethra:  normal appearing urethra with no masses, tenderness or lesions              Bartholin's and Skene's: normal                 Vagina: normal  appearing vagina with normal color and discharge, no lesions              Cervix: absent              Pap taken: No. Bimanual Exam:  Uterus:  uterus absent              Adnexa: no mass, fullness, tenderness               Rectovaginal: Confirms               Anus:  normal sphincter tone, no lesions  A:  Well Woman with normal exam  Postmenopausal on ERT since 12/2008  - now off  S/P TVH and AP repair 1993   Diagnosis of Meningioma and had radiation treatments 2014- (found secondary to double vision right eye)  P:   Reviewed health and wellness pertinent to exam  Pap smear not taken today  Mammogram is due now and will schedule  IFOB is given  Counseled on breast self exam, mammography screening, adequate intake of calcium and vitamin D, diet and exercise, Kegel's exercises return annually or prn  An After Visit Summary was printed and given to the patient.

## 2014-06-30 NOTE — Patient Instructions (Signed)

## 2014-07-01 ENCOUNTER — Other Ambulatory Visit: Payer: Self-pay | Admitting: Nurse Practitioner

## 2014-07-01 DIAGNOSIS — Z1231 Encounter for screening mammogram for malignant neoplasm of breast: Secondary | ICD-10-CM

## 2014-07-05 NOTE — Progress Notes (Signed)
Encounter reviewed by Dr. Brook Silva.  

## 2014-07-07 ENCOUNTER — Telehealth: Payer: Self-pay | Admitting: *Deleted

## 2014-07-07 NOTE — Telephone Encounter (Signed)
Verified with patient, she is not taking ERT at this time.  Thanked pt for calling back to clarify.

## 2014-07-07 NOTE — Telephone Encounter (Signed)
Message copied by Graylon Good on Mon Jul 07, 2014 10:32 AM ------      Message from: [name] R      Created: Sun Jul 06, 2014 12:23 PM      Regarding: FW: Is patient off ERT?       Can you please call her a verify that she stopped HRT this past year.  I am almost certain this is correct information --- but if still on I did not give her RX.  Please let me know then I can correct chart entry and send back to Dr. Quincy Simmonds.      ----- Message -----         From: Jamey Reas de Berton Lan, MD         Sent: 07/05/2014   9:17 PM           To: Milford Cage, FNP      Subject: Is patient off ERT?                                      Hi Patty,             Can you check to see if you meant to say patient is off ERT since 2010?  I did not see estrogen on her med list.             You can close the encounter after you check this.            Thanks!            Brook       ------

## 2014-07-07 NOTE — Telephone Encounter (Signed)
Pt is calling stephanie back °

## 2014-07-07 NOTE — Telephone Encounter (Signed)
I have attempted to contact this patient by phone with the following results: left message to return my call on answering machine (home/mobile). [phone]

## 2014-07-08 ENCOUNTER — Ambulatory Visit (HOSPITAL_COMMUNITY)
Admission: RE | Admit: 2014-07-08 | Discharge: 2014-07-08 | Disposition: A | Discharge status: Home / Self Care | Payer: 59 | Source: Ambulatory Visit | Attending: Nurse Practitioner | Admitting: Nurse Practitioner

## 2014-07-08 DIAGNOSIS — Z1231 Encounter for screening mammogram for malignant neoplasm of breast: Secondary | ICD-10-CM | POA: Insufficient documentation

## 2014-07-14 ENCOUNTER — Encounter: Payer: Self-pay | Admitting: Nurse Practitioner

## 2014-07-17 LAB — FECAL OCCULT BLOOD, IMMUNOCHEMICAL: IMMUNOLOGICAL FECAL OCCULT BLOOD TEST: NEGATIVE

## 2014-07-17 NOTE — Addendum Note (Signed)
Addended by: Graylon Good on: 07/17/2014 10:32 AM   Modules accepted: Orders

## 2014-10-24 ENCOUNTER — Other Ambulatory Visit: Payer: Self-pay | Admitting: Radiation Therapy

## 2014-10-24 DIAGNOSIS — D329 Benign neoplasm of meninges, unspecified: Secondary | ICD-10-CM

## 2014-11-17 ENCOUNTER — Ambulatory Visit: Payer: 59

## 2014-11-18 ENCOUNTER — Ambulatory Visit
Admission: RE | Admit: 2014-11-18 | Discharge: 2014-11-18 | Disposition: A | Discharge status: Home / Self Care | Payer: 59 | Source: Ambulatory Visit | Attending: Radiation Oncology | Admitting: Radiation Oncology

## 2014-11-18 DIAGNOSIS — D329 Benign neoplasm of meninges, unspecified: Secondary | ICD-10-CM | POA: Diagnosis not present

## 2014-11-18 LAB — BUN AND CREATININE (CC13)
BUN: 13.7 mg/dL (ref 7.0–26.0)
CREATININE: 0.7 mg/dL (ref 0.6–1.1)
EGFR: 90 mL/min/{1.73_m2} — AB (ref >90)

## 2014-11-21 ENCOUNTER — Ambulatory Visit (HOSPITAL_COMMUNITY)
Admission: RE | Admit: 2014-11-21 | Discharge: 2014-11-21 | Disposition: A | Discharge status: Home / Self Care | Payer: 59 | Source: Ambulatory Visit | Attending: Radiation Oncology | Admitting: Radiation Oncology

## 2014-11-21 DIAGNOSIS — D329 Benign neoplasm of meninges, unspecified: Secondary | ICD-10-CM | POA: Diagnosis not present

## 2014-11-21 MED ORDER — GADOBENATE DIMEGLUMINE 529 MG/ML IV SOLN
20.0000 mL | Freq: Once | INTRAVENOUS | Status: AC | PRN
  Administered 2014-11-21: 20 mL via INTRAVENOUS

## 2014-11-23 NOTE — Progress Notes (Signed)
Radiation Oncology         [phone]     Multidisciplinary Brain and Spine Oncology Clinic Follow-Up Visit Note  CC: [name], MD  [name]*  Diagnosis:   59 year old woman s/p fractionated stereotactic radiotherapy to a right sphenoid wing 2.6 x 1.9 x 1.6 cm meningioma invading the sella encasing the right optic nerve and deviating the chiasm 05/09/2013-06/19/2013 to 52.2 Gy in 29 fractions of 1.8 Gy    Interval Since Last Radiation:  17  months  Narrative:  The patient returns today for routine follow-up.  The recent films were presented in our multidisciplinary conference with neuroradiology just prior to the clinic.  Weight and vitals stable. Denies pain. Denies headache, dizziness, nausea, vomiting, diplopia or ringing in the ears. Speech fluid and appropriate. Steady gait noted. Eating and sleeping without difficulty                              ALLERGIES:  has No Known Allergies.  Meds: Current Outpatient Prescriptions  Medication Sig Dispense Refill  . Ascorbic Acid (VITAMIN C) 1000 MG tablet Take 1,000 mg by mouth daily.    Marland Kitchen aspirin EC 81 MG tablet Take 4 tablets (325 mg total) by mouth daily.    Marland Kitchen atorvastatin (LIPITOR) 20 MG tablet Take 20 mg by mouth daily.    . calcium-vitamin D 250-100 MG-UNIT per tablet Take 1 tablet by mouth daily.     . Cyanocobalamin (VITAMIN B-12 PO) Take by mouth daily.    . hydrochlorothiazide (HYDRODIURIL) 25 MG tablet Take 25 mg by mouth every morning.     . Multiple Vitamin (MULTIVITAMIN WITH MINERALS) TABS tablet Take 1 tablet by mouth daily.    . Omega-3 Fatty Acids (FISH OIL PO) Take by mouth daily.     No current facility-administered medications for this encounter.    Physical Findings: The patient is in no acute distress. Patient is alert and oriented.  weight is 135 lb (61.236 kg). Her oral temperature is 98 F (36.7 C). Her blood pressure is 143/81 and her pulse is 64. Her respiration is 16 and  oxygen saturation is 100%. .  No significant changes.  Lab Findings: Lab Results  Component Value Date   WBC 5.0 08/16/2013   HGB 12.6 08/16/2013   HCT 37.0 08/16/2013   MCV 97.9 08/16/2013   PLT 288 08/16/2013    @LASTCHEM @  Radiographic Findings: Mr [name] ZO Contrast  11/21/2014   CLINICAL DATA:  Right sphenoid wing meningioma.  Status post SRS.  EXAM: MRI HEAD WITHOUT AND WITH CONTRAST  TECHNIQUE: Multiplanar, multiecho pulse sequences of the brain and surrounding structures were obtained without and with intravenous contrast.  CONTRAST:  20 mL MultiHance  COMPARISON:  MRI brain without and with contrast 05/23/2014.  FINDINGS: A right sphenoid wing and cavernous sinus meningioma is stable to slightly decreased in size. The lesion now measures 3.1 x 2.1 x 2.0 cm compared with 3.2 x 2.0 x 2.2 cm. There is persistent leftward deviation of the pituitary stalk and up lifting of the distal cavernous right internal carotid artery. The right optic nerve is displaced by the lesion.  No acute infarct hemorrhage is present. Scattered subcortical T2 hyperintensities are again noted, advanced for age.  Flow is present in the major intracranial arteries. The globes and orbits are otherwise intact. The paranasal sinuses and the mastoid air cells are clear.  IMPRESSION: 1. Stable to  slight decrease in size of a right sphenoid wing and cavernous sinus meningioma now measuring 3.1 x 2.1 x 2.0 cm. There is still some mass effect on the right orbital apex with displacement of the right optic nerve, decreased from the original scans. 2. Scattered subcortical T2 hyperintensities bilaterally are similar to the original scan. The finding is nonspecific but can be seen in the setting of chronic microvascular ischemia, a demyelinating process such as multiple sclerosis, vasculitis, complicated migraine headaches, or as the sequelae of a prior infectious or inflammatory process. 3. No acute intracranial abnormality or  significant change from the prior study otherwise.   Electronically Signed   By: San Morelle M.D.   On: 11/21/2014 19:11    Impression:  The patient is stable with no recurrence of meningioma  Plan:  MRI in 6 months then follow-up  _____________________________________  Sheral Apley. [name], M.D.

## 2014-11-24 ENCOUNTER — Encounter: Payer: Self-pay | Admitting: Radiation Oncology

## 2014-11-24 ENCOUNTER — Ambulatory Visit
Admission: RE | Admit: 2014-11-24 | Discharge: 2014-11-24 | Disposition: A | Discharge status: Home / Self Care | Payer: 59 | Source: Ambulatory Visit | Attending: Radiation Oncology | Admitting: Radiation Oncology

## 2014-11-24 VITALS — BP 143/81 | HR 64 | Temp 98.0°F | Resp 16 | Wt 135.0 lb

## 2014-11-24 DIAGNOSIS — D329 Benign neoplasm of meninges, unspecified: Secondary | ICD-10-CM

## 2014-11-24 NOTE — Progress Notes (Signed)
Weight and vitals stable. Denies pain. Denies headache, dizziness, nausea, vomiting, diplopia or ringing in the ears. Speech fluid and appropriate. Steady gait noted. Eating and sleeping without difficulty.

## 2014-12-11 ENCOUNTER — Other Ambulatory Visit: Payer: Self-pay | Admitting: Radiation Therapy

## 2014-12-11 DIAGNOSIS — D329 Benign neoplasm of meninges, unspecified: Secondary | ICD-10-CM

## 2015-01-28 ENCOUNTER — Encounter: Payer: Self-pay | Admitting: Gastroenterology

## 2015-05-14 ENCOUNTER — Encounter: Payer: Self-pay | Admitting: Radiation Therapy

## 2015-05-14 ENCOUNTER — Other Ambulatory Visit: Payer: Self-pay | Admitting: Radiation Therapy

## 2015-05-14 DIAGNOSIS — D329 Benign neoplasm of meninges, unspecified: Secondary | ICD-10-CM

## 2015-05-14 NOTE — Progress Notes (Signed)
1.  Do you need a wheel chair?    No  2. On oxygen? No  3. Have you ever had any surgery in the body part being scanned?  No  4. Have you ever had any surgery on your brain or heart?       No  5. Have you ever had surgery on your eyes or ears?                  No  6. Do you have a pacemaker or defibrillator?   No  7. Do you have a Neurostimulator?          No  8. Claustrophobic?  No  9. Any risk for metal in eyes?  No  10. Injury by bullet, buckshot, or shrapnel?   No  11. Stent?   No                                                                                                                                  12. Hx of Cancer?         No                                                                                                       13. Kidney or Liver disease?  No  14. Hx of Lupus, Rheumatoid Arthritis or Scleroderma?  No  15. IV Antibiotics or long term use of NSAIDS?  No  16. HX of Hypertension?  Yes  17. Diabetes?  No 18. Allergy to contrast? No  19. Recent labs.   Please draw same day prior to appointment

## 2015-05-21 ENCOUNTER — Ambulatory Visit: Payer: 59

## 2015-05-22 ENCOUNTER — Ambulatory Visit (HOSPITAL_COMMUNITY): Payer: 59

## 2015-05-25 ENCOUNTER — Ambulatory Visit: Payer: Self-pay | Admitting: Radiation Oncology

## 2015-06-09 ENCOUNTER — Ambulatory Visit (HOSPITAL_COMMUNITY): Payer: 59

## 2015-06-15 ENCOUNTER — Ambulatory Visit: Payer: 59 | Admitting: Radiation Oncology

## 2015-07-02 ENCOUNTER — Ambulatory Visit: Payer: 59 | Admitting: Nurse Practitioner

## 2015-07-07 ENCOUNTER — Encounter: Payer: Self-pay | Admitting: Nurse Practitioner

## 2015-07-07 ENCOUNTER — Ambulatory Visit (INDEPENDENT_AMBULATORY_CARE_PROVIDER_SITE_OTHER): Payer: 59 | Admitting: Nurse Practitioner

## 2015-07-07 VITALS — BP 118/70 | HR 60 | Resp 14 | Ht 61.0 in | Wt 140.0 lb

## 2015-07-07 DIAGNOSIS — Z01419 Encounter for gynecological examination (general) (routine) without abnormal findings: Secondary | ICD-10-CM | POA: Diagnosis not present

## 2015-07-07 DIAGNOSIS — Z1211 Encounter for screening for malignant neoplasm of colon: Secondary | ICD-10-CM

## 2015-07-07 DIAGNOSIS — E2839 Other primary ovarian failure: Secondary | ICD-10-CM

## 2015-07-07 DIAGNOSIS — Z0000 Encounter for general adult medical examination without abnormal findings: Secondary | ICD-10-CM

## 2015-07-07 NOTE — Progress Notes (Signed)
Patient ID: Stephanie Beard, female   DOB: 11-Sep-1956, 59 y.o.   MRN: [id number] 59 y.o. G65P2002 Married  Caucasian Fe here for annual exam.  Now vision is improved with peripheral.  Still gets MRI every 6 months. Now working for [employer] for 6 wks.  Patient's last menstrual period was 01/11/1992.          Sexually active: Yes.    The current method of family planning is status post hysterectomy.    Exercising: Yes.    running Smoker:  no  Health Maintenance: Pap:  2011 WNL  MMG:  07-09-14 WNL will schedule Colonoscopy: 02/2009 normal repeat in 10 years - IFOB is given BMD:   10/06/2008 spine: -0.6; left hip neck -1.3; right hip neck -1.7 TDaP:  06-05-08 Labs: PCP does labs   reports that she has never smoked. She has never used smokeless tobacco. She reports that she drinks about 1.8 oz of alcohol per week. She reports that she does not use illicit drugs.  Past Medical History  Diagnosis Date  . Meningioma ([place]) 04/2013    brain MRI(hx. "double vision" and treatment with radiation-last tx. radiation 8'14(no further problems)  . Hypertension 2012  . Arthritis     Right knee osteoarthritis    Past Surgical History  Procedure Laterality Date  . Wisdom tooth extraction  1978  . Tubal ligation Bilateral 1992  . Vaginal hysterectomy  1993    secondary prolapse w/A&P repair  . Cesarean section  1990  . Knee arthroscopy Right 08/22/2013    Procedure: RIGHT KNEE ARTHROSCOPY WITH PARTIAL MEDIAL and LATERAL MENISECTOMY/DEBRIDEMENT;  Surgeon: Johnn Hai, MD;  Location: [facility];  Service: Orthopedics;  Laterality: Right;    Current Outpatient Prescriptions  Medication Sig Dispense Refill  . Ascorbic Acid (VITAMIN C) 1000 MG tablet Take 1,000 mg by mouth daily.    Marland Kitchen aspirin EC 81 MG tablet Take 4 tablets (325 mg total) by mouth daily.    Marland Kitchen atorvastatin (LIPITOR) 20 MG tablet Take 20 mg by mouth daily.    . calcium-vitamin D 250-100 MG-UNIT per tablet Take 1 tablet by mouth  daily.     . Cyanocobalamin (VITAMIN B-12 PO) Take by mouth daily.    . hydrochlorothiazide (HYDRODIURIL) 25 MG tablet Take 25 mg by mouth every morning.     . Multiple Vitamin (MULTIVITAMIN WITH MINERALS) TABS tablet Take 1 tablet by mouth daily.    . Omega-3 Fatty Acids (FISH OIL PO) Take by mouth daily.     No current facility-administered medications for this visit.    Family History  Problem Relation Age of Onset  . Hypertension Mother   . Hypercholesterolemia Mother   . Heart disease Mother   . Heart disease Father   . Hypertension Father   . Hypertension Brother   . Hyperlipidemia Brother     ROS:  Pertinent items are noted in HPI.  Otherwise, a comprehensive ROS was negative.  Exam:   BP 118/70 mmHg  Pulse 60  Resp 14  Ht 5\' 1"  (1.549 m)  Wt 140 lb (63.504 kg)  BMI 26.47 kg/m2  LMP 01/11/1992 Height: 5\' 1"  (154.9 cm) Ht Readings from Last 3 Encounters:  07/07/15 5\' 1"  (1.549 m)  06/30/14 5\' 1"  (1.549 m)  08/16/13 5\' 1"  (1.549 m)    General appearance: alert, cooperative and appears stated age Head: Normocephalic, without obvious abnormality, atraumatic Neck: no adenopathy, supple, symmetrical, trachea midline and thyroid normal to inspection and palpation Lungs:  clear to auscultation bilaterally Breasts: normal appearance, no masses or tenderness Heart: regular rate and rhythm Abdomen: soft, non-tender; no masses,  no organomegaly Extremities: extremities normal, atraumatic, no cyanosis or edema Skin: Skin color, texture, turgor normal. No rashes or lesions Lymph nodes: Cervical, supraclavicular, and axillary nodes normal. No abnormal inguinal nodes palpated Neurologic: Grossly normal   Pelvic: External genitalia:  no lesions              Urethra:  normal appearing urethra with no masses, tenderness or lesions              Bartholin's and Skene's: normal                 Vagina: normal appearing vagina with normal color and discharge, no lesions               Cervix: absent              Pap taken: No. Bimanual Exam:  Uterus:  uterus absent              Adnexa: no mass, fullness, tenderness               Rectovaginal: Confirms               Anus:  normal sphincter tone, no lesions  Chaperone present: no  A:  Well Woman with normal exam  Postmenopausal on ERT since 12/2008 - now off since 09/2013 S/P TVH and AP repair 1993  Diagnosis of Brain Meningioma and had radiation treatments 2014- (found secondary to double vision right eye)   P:   Reviewed health and wellness pertinent to exam  Pap smear as above  Mammogram is due and will schedule  IFOB is given  Counseled on breast self exam, mammography screening, adequate intake of calcium and vitamin D, diet and exercise return annually or prn  An After Visit Summary was printed and given to the patient.

## 2015-07-07 NOTE — Patient Instructions (Addendum)

## 2015-07-10 NOTE — Progress Notes (Signed)
Encounter reviewed by Dr. Nathan Moctezuma Amundson C. Silva.  

## 2015-07-14 ENCOUNTER — Other Ambulatory Visit: Payer: Self-pay

## 2015-07-14 DIAGNOSIS — Z1231 Encounter for screening mammogram for malignant neoplasm of breast: Secondary | ICD-10-CM

## 2015-07-24 ENCOUNTER — Ambulatory Visit
Admission: RE | Admit: 2015-07-24 | Discharge: 2015-07-24 | Disposition: A | Discharge status: Home / Self Care | Payer: 59 | Source: Ambulatory Visit | Attending: Radiation Oncology | Admitting: Radiation Oncology

## 2015-07-24 DIAGNOSIS — D329 Benign neoplasm of meninges, unspecified: Secondary | ICD-10-CM

## 2015-07-24 MED ORDER — GADOBENATE DIMEGLUMINE 529 MG/ML IV SOLN
12.0000 mL | Freq: Once | INTRAVENOUS | Status: AC | PRN
  Administered 2015-07-24: 12 mL via INTRAVENOUS

## 2015-07-26 ENCOUNTER — Encounter: Payer: Self-pay | Admitting: Radiation Therapy

## 2015-07-27 ENCOUNTER — Encounter: Payer: Self-pay | Admitting: Radiation Oncology

## 2015-07-27 ENCOUNTER — Ambulatory Visit
Admission: RE | Admit: 2015-07-27 | Discharge: 2015-07-27 | Disposition: A | Discharge status: Home / Self Care | Payer: 59 | Source: Ambulatory Visit | Attending: Radiation Oncology | Admitting: Radiation Oncology

## 2015-07-27 VITALS — BP 129/90 | HR 73 | Resp 16

## 2015-07-27 DIAGNOSIS — D329 Benign neoplasm of meninges, unspecified: Secondary | ICD-10-CM

## 2015-07-27 NOTE — Progress Notes (Signed)
Radiation Oncology         [phone]     Multidisciplinary Brain and Spine Oncology Clinic Follow-Up Visit Note  CC: Stephanie Heck, MD  Stephanie Beard*  Diagnosis:   59 year old woman s/p fractionated stereotactic radiotherapy to a right sphenoid wing 2.6 x 1.9 x 1.6 cm meningioma invading the sella encasing the right optic nerve and deviating the chiasm 05/09/2013-06/19/2013 to 52.2 Gy in 29 fractions of 1.8 Gy     ICD-9-CM ICD-10-CM   1. Meningioma ([facility]) 225.2 D32.9    Interval Since Last Radiation:  2 years and 1 month  Narrative:  The patient returns today for routine follow-up. Denies pain, headache, dizziness, nausea, vomiting, diplopia, or ringing in the ears. Steady gait noted by the nurse. Speech fluid and appropriate. Eating and sleeping without difficulty.   ALLERGIES:  has No Known Allergies.  Meds: Current Outpatient Prescriptions  Medication Sig Dispense Refill  . Ascorbic Acid (VITAMIN C) 1000 MG tablet Take 1,000 mg by mouth daily.    Marland Kitchen aspirin EC 81 MG tablet Take 4 tablets (325 mg total) by mouth daily.    Marland Kitchen atorvastatin (LIPITOR) 20 MG tablet Take 20 mg by mouth daily.    . calcium-vitamin D 250-100 MG-UNIT per tablet Take 1 tablet by mouth daily.     . Cyanocobalamin (VITAMIN B-12 PO) Take by mouth daily.    . hydrochlorothiazide (HYDRODIURIL) 25 MG tablet Take 25 mg by mouth every morning.     . Multiple Vitamin (MULTIVITAMIN WITH MINERALS) TABS tablet Take 1 tablet by mouth daily.    . Omega-3 Fatty Acids (FISH OIL PO) Take by mouth daily.     No current facility-administered medications for this encounter.    Physical Findings: The patient is in no acute distress. Patient is alert and oriented.  blood pressure is 129/90 and her pulse is 73. Her respiration is 16 and oxygen saturation is 100%.  Neurologically intact.  Vision intact. No significant changes.  Lab Findings: Lab Results  Component Value Date   WBC 5.0 08/16/2013   HGB 12.6 08/16/2013   HCT 37.0 08/16/2013   MCV 97.9 08/16/2013   PLT 288 08/16/2013    Radiographic Findings: Mr [name] [id number] Contrast  07/24/2015  CLINICAL DATA:  59 year old woman s/p fractionated stereotactic radiotherapy to a right sphenoid wing 2.6 x 1.9 x 1.6 cm meningioma invading the sella encasing the right optic nerve and deviating the chiasm 05/09/2013-06/19/2013 to 52.2 Gy in 29 fractions of 1.8 Gy. Interval since last radiation approximately 25 months. Subsequent encounter. EXAM: MRI HEAD WITHOUT AND WITH CONTRAST TECHNIQUE: Multiplanar, multiecho pulse sequences of the brain and surrounding structures were obtained without and with intravenous contrast. CONTRAST:  70mL MULTIHANCE GADOBENATE DIMEGLUMINE 529 MG/ML IV SOLN COMPARISON:  Multiple priors, most recent 11/21/2014. FINDINGS: RIGHT sphenoid wing and cavernous sinus meningioma is redemonstrated. The lesion is minimally smaller in comparison with most recent priors. Measurements today are 20 x 29 x 18 mm (R-L x A-P x C-C). This compared previously with 20 x 31 x 21 mm (R-L x A-P x C-C). There is persistent leftward deviation of pituitary stalk, and mass effect on the RIGHT internal carotid artery within the cavernous sinus. The RIGHT optic nerve remains medially displaced proximally by the lesion. Slight cerebral and cerebellar atrophy. Mild subcortical and periventricular T2 and FLAIR hyperintensities, stable, likely chronic microvascular ischemic change. Post treatment changes less favored. Flow voids are maintained. No tonsillar herniation. Extracranial soft tissues are unremarkable. IMPRESSION:  Slight decreased size RIGHT sphenoid wing and cavernous sinus meningioma, 25 months status post SRS, in comparison with most recent priors. See discussion above. Electronically Signed   By: Staci Righter M.D.   On: 07/24/2015 17:33    Impression:  The patient is stable with no recurrence of meningioma.  Plan:  MRI in 6 months then  follow-up.  _____________________________________  Sheral Apley. [name], M.D.  This document serves as a record of services personally performed by Tyler Pita, MD. It was created on his behalf by Darcus Austin, a trained medical scribe. The creation of this record is based on the scribe's personal observations and the provider's statements to them. This document has been checked and approved by the attending provider.

## 2015-07-27 NOTE — Progress Notes (Addendum)
Weight and vitals stable. Denies pain. Denies headache, dizziness, nausea, vomiting, diplopia or ringing in the ears. Steady gait noted. Speech fluid and appropriate. Eating and sleeping without difficulty.  BP 129/90 mmHg  Pulse 73  Resp 16  Wt   SpO2 100%  LMP 01/11/1992 Wt Readings from Last 3 Encounters:  07/07/15 140 lb (63.504 kg)  11/24/14 135 lb (61.236 kg)  06/30/14 138 lb (62.596 kg)

## 2015-07-30 LAB — FECAL OCCULT BLOOD, IMMUNOCHEMICAL: IFOBT: NEGATIVE

## 2015-07-30 NOTE — Addendum Note (Signed)
Addended by: Emelia Salisbury C on: 07/30/2015 12:00 PM   Modules accepted: Orders

## 2015-08-18 ENCOUNTER — Ambulatory Visit
Admission: RE | Admit: 2015-08-18 | Discharge: 2015-08-18 | Disposition: A | Discharge status: Home / Self Care | Payer: 59 | Source: Ambulatory Visit

## 2015-08-18 DIAGNOSIS — Z1231 Encounter for screening mammogram for malignant neoplasm of breast: Secondary | ICD-10-CM

## 2016-01-12 ENCOUNTER — Other Ambulatory Visit: Payer: Self-pay | Admitting: Radiation Therapy

## 2016-01-12 DIAGNOSIS — D329 Benign neoplasm of meninges, unspecified: Secondary | ICD-10-CM

## 2016-01-21 ENCOUNTER — Ambulatory Visit
Admission: RE | Admit: 2016-01-21 | Discharge: 2016-01-21 | Disposition: A | Discharge status: Home / Self Care | Payer: BLUE CROSS/BLUE SHIELD | Source: Ambulatory Visit | Attending: Radiation Oncology | Admitting: Radiation Oncology

## 2016-01-21 DIAGNOSIS — D329 Benign neoplasm of meninges, unspecified: Secondary | ICD-10-CM | POA: Diagnosis not present

## 2016-01-21 MED ORDER — GADOBENATE DIMEGLUMINE 529 MG/ML IV SOLN
13.0000 mL | Freq: Once | INTRAVENOUS | Status: AC | PRN
  Administered 2016-01-21: 13 mL via INTRAVENOUS

## 2016-01-25 ENCOUNTER — Ambulatory Visit
Admission: RE | Admit: 2016-01-25 | Discharge: 2016-01-25 | Disposition: A | Discharge status: Home / Self Care | Payer: BLUE CROSS/BLUE SHIELD | Source: Ambulatory Visit | Attending: Radiation Oncology | Admitting: Radiation Oncology

## 2016-01-25 ENCOUNTER — Encounter: Payer: Self-pay | Admitting: Radiation Oncology

## 2016-01-25 VITALS — BP 135/80 | HR 71 | Resp 16

## 2016-01-25 DIAGNOSIS — D329 Benign neoplasm of meninges, unspecified: Secondary | ICD-10-CM

## 2016-01-25 NOTE — Progress Notes (Signed)
Vitals stable. Patient refused weight. Denies pain. Denies headache, dizziness, nausea, vomiting, diplopia or ringing in the ears. Steady gait noted. Speech fluid and appropriate. Eating and sleeping without difficulty. +  BP 135/80 mmHg  Pulse 71  Resp 16  Wt   SpO2 100%  LMP 01/11/1992 Wt Readings from Last 3 Encounters:  07/07/15 140 lb (63.504 kg)  11/24/14 135 lb (61.236 kg)  06/30/14 138 lb (62.596 kg)

## 2016-01-25 NOTE — Progress Notes (Signed)
Radiation Oncology         [phone]     Multidisciplinary Brain and Spine Oncology Clinic Follow-Up Visit Note  CC: [name], MD  Stephanie Beard*  Diagnosis:  60 y.o. 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 Meningioma (HCC) 225.2 D32.9      Interval Since Last Radiation:  2 years and 7 months  05/09/2013-06/19/2013: The meningioma was conformally treated to 52.2 Gy in 29 fractions of 1.8 Gy  Narrative:  The patient returns today for routine follow-up. MRI of the brain on 01/21/16 showed continued stable post treatment appearance of the right cavernous sinus meningioma and no new intracranial abnormalities. The patient denies pain, headache, dizziness, nausea, vomiting, diplopia, or ringing in the ears. Steady gait noted by the nurse. Speech fluid and appropriate. Eating and sleeping without difficulty.  ALLERGIES:  has No Known Allergies.  Meds: Current Outpatient Prescriptions  Medication Sig Dispense Refill  . Ascorbic Acid (VITAMIN C) 1000 MG tablet Take 1,000 mg by mouth daily.    Marland Kitchen aspirin EC 81 MG tablet Take 4 tablets (325 mg total) by mouth daily.    . calcium-vitamin D 250-100 MG-UNIT per tablet Take 1 tablet by mouth daily.     . Cyanocobalamin (VITAMIN B-12 PO) Take by mouth daily.    . hydrochlorothiazide (HYDRODIURIL) 25 MG tablet Take 25 mg by mouth every morning.     . lovastatin (MEVACOR) 20 MG tablet   0  . Multiple Vitamin (MULTIVITAMIN WITH MINERALS) TABS tablet Take 1 tablet by mouth daily.    . Omega-3 Fatty Acids (FISH OIL PO) Take by mouth daily.     No current facility-administered medications for this encounter.    Physical Findings: The patient is in no acute distress. Patient is alert and oriented.  blood pressure is 135/80 and her pulse is 71. Her  respiration is 16 and oxygen saturation is 100%.  Neurologically intact.  Vision intact. No significant changes.  Lab Findings: Lab Results  Component Value Date   WBC 5.0 08/16/2013   HGB 12.6 08/16/2013   HCT 37.0 08/16/2013   MCV 97.9 08/16/2013   PLT 288 08/16/2013    Radiographic Findings: Mr [name] [id number] Contrast  01/21/2016  CLINICAL DATA:  60 year old female status post stereotactic radiotherapy to right sphenoid wing meningioma from August 13 June 2013. Restaging. Subsequent encounter. Creatinine was obtained on site at [facility] at [address] Results: Creatinine 0.6 mg/dL. EXAM: MRI HEAD WITHOUT AND WITH CONTRAST TECHNIQUE: Multiplanar, multiecho pulse sequences of the brain and surrounding structures were obtained without and with intravenous contrast. CONTRAST:  22mL MULTIHANCE GADOBENATE DIMEGLUMINE 529 MG/ML IV SOLN COMPARISON:  07/24/2015 and earlier. FINDINGS: Homogeneously enhancing meningioma centered at the right cavernous sinus re- demonstrated and stable since 07/24/2015, with the most confluent portion of the meningioma encompassing 32 x 20 x 20 mm (AP by transverse by CC). As before, the lesion tapers along the right tentorium resulting in an indistinct posterior margin. Stable appearance of the cavernous sinus and pituitary structures (series 11, image 27). Stable borderline to mild involvement of the right orbital apex. No other abnormal intracranial enhancement or dural thickening. Small right parietal lobe developmental venous anomaly. Major intracranial vascular flow voids are stable, including the right ICA siphon. Cerebral volume is stable and within  normal limits. No restricted diffusion to suggest acute infarction. No midline shift, ventriculomegaly, extra-axial collection or acute intracranial hemorrhage. Cervicomedullary junction and pituitary are within normal limits. Negative visualized cervical spine. Fairly numerous small mostly subcortical white  matter foci of T2 and FLAIR hyperintensity are reduced are re - demonstrated and not significantly changed. No cortical encephalomalacia. No chronic cerebral blood products. Normal deep gray matter nuclei, brainstem and cerebellum. Visible internal auditory structures appear normal. Mastoids remain clear. Minor paranasal sinus mucosal thickening. Negative orbit and scalp soft tissues. Normal bone marrow signal. IMPRESSION: 1. Continued stable post treatment appearance of the right cavernous sinus meningioma. 2. No new intracranial abnormality. Chronic nonspecific cerebral white matter signal changes. Electronically Signed   By: Genevie Ann M.D.   On: 01/21/2016 11:59    Impression:  The patient is stable with no recurrence of meningioma.  Plan:  Repeat MRI and follow up in 1 year.  _____________________________________  Sheral Apley. [name], M.D.  This document serves as a record of services personally performed by Tyler Pita, MD. It was created on his behalf by Darcus Austin, a trained medical scribe. The creation of this record is based on the scribe's personal observations and the provider's statements to them. This document has been checked and approved by the attending provider.

## 2016-03-02 DIAGNOSIS — I10 Essential (primary) hypertension: Secondary | ICD-10-CM | POA: Diagnosis not present

## 2016-03-02 DIAGNOSIS — E785 Hyperlipidemia, unspecified: Secondary | ICD-10-CM | POA: Diagnosis not present

## 2016-03-08 DIAGNOSIS — M1711 Unilateral primary osteoarthritis, right knee: Secondary | ICD-10-CM | POA: Diagnosis not present

## 2016-05-06 DIAGNOSIS — H6123 Impacted cerumen, bilateral: Secondary | ICD-10-CM | POA: Diagnosis not present

## 2016-05-06 DIAGNOSIS — H938X3 Other specified disorders of ear, bilateral: Secondary | ICD-10-CM | POA: Diagnosis not present

## 2016-05-06 DIAGNOSIS — L298 Other pruritus: Secondary | ICD-10-CM | POA: Diagnosis not present

## 2016-06-22 DIAGNOSIS — Z23 Encounter for immunization: Secondary | ICD-10-CM | POA: Diagnosis not present

## 2016-07-11 ENCOUNTER — Telehealth: Payer: Self-pay | Admitting: Nurse Practitioner

## 2016-07-11 NOTE — Telephone Encounter (Signed)
LMTCB ABOUT APPT/RD

## 2016-07-12 ENCOUNTER — Ambulatory Visit (INDEPENDENT_AMBULATORY_CARE_PROVIDER_SITE_OTHER): Payer: BLUE CROSS/BLUE SHIELD | Admitting: Nurse Practitioner

## 2016-07-12 ENCOUNTER — Encounter: Payer: Self-pay | Admitting: Nurse Practitioner

## 2016-07-12 VITALS — BP 160/90 | HR 64 | Resp 14 | Ht 60.75 in | Wt 138.0 lb

## 2016-07-12 DIAGNOSIS — Z1211 Encounter for screening for malignant neoplasm of colon: Secondary | ICD-10-CM

## 2016-07-12 DIAGNOSIS — Z0000 Encounter for general adult medical examination without abnormal findings: Secondary | ICD-10-CM

## 2016-07-12 DIAGNOSIS — Z01419 Encounter for gynecological examination (general) (routine) without abnormal findings: Secondary | ICD-10-CM | POA: Diagnosis not present

## 2016-07-12 NOTE — Progress Notes (Signed)
60 y.o. G45P2002 Married  Caucasian Fe here for annual exam. No new health problems this year.  Her 1 yr check for MRI of brain was normal.  No visual changes.  Feels well.  Patient's last menstrual period was 01/11/1992.          Sexually active: Yes.    The current method of family planning is post hysterectomy.    Exercising: Yes.    running/ walking  Smoker:  no  Health Maintenance: Pap:  2011 WNL post hysterectomy MMG:  08-20-15 WNL Colonoscopy:  03-02-09 repeat in 10 yrs -   IFOB given here BMD:   10-06-08  spine: -0.6; left hip neck -1.3; right hip neck -1.7 TDaP:  06-05-08 Shingles: Never Hep C and HIV: done today Labs: PCP does labs    reports that she has never smoked. She has never used smokeless tobacco. She reports that she drinks about 3.0 oz of alcohol per week . She reports that she does not use drugs.  Past Medical History:  Diagnosis Date  . Arthritis    Right knee osteoarthritis  . Hypertension 2012  . Meningioma ([place]) 04/2013   brain MRI(hx. "double vision" and treatment with radiation-last tx. radiation 8'14(no further problems)    Past Surgical History:  Procedure Laterality Date  . ABDOMINAL HYSTERECTOMY    . CESAREAN SECTION  1990  . KNEE ARTHROSCOPY Right 08/22/2013   Procedure: RIGHT KNEE ARTHROSCOPY WITH PARTIAL MEDIAL and LATERAL MENISECTOMY/DEBRIDEMENT;  Surgeon: Johnn Hai, MD;  Location: [facility];  Service: Orthopedics;  Laterality: Right;  . TUBAL LIGATION Bilateral 1992  . VAGINAL HYSTERECTOMY  1993   secondary prolapse w/A&P repair  . WISDOM TOOTH EXTRACTION  1978    Current Outpatient Prescriptions  Medication Sig Dispense Refill  . Ascorbic Acid (VITAMIN C) 1000 MG tablet Take 1,000 mg by mouth daily.    Marland Kitchen aspirin EC 81 MG tablet Take 4 tablets (325 mg total) by mouth daily.    . calcium-vitamin D 250-100 MG-UNIT per tablet Take 1 tablet by mouth daily.     . Cyanocobalamin (VITAMIN B-12 PO) Take by mouth daily.    . hydrochlorothiazide  (HYDRODIURIL) 25 MG tablet Take 25 mg by mouth every morning.     . lovastatin (MEVACOR) 20 MG tablet   0  . Multiple Vitamin (MULTIVITAMIN WITH MINERALS) TABS tablet Take 1 tablet by mouth daily.    . Omega-3 Fatty Acids (FISH OIL PO) Take by mouth daily.     No current facility-administered medications for this visit.     Family History  Problem Relation Age of Onset  . Hypertension Mother   . Hypercholesterolemia Mother   . Heart disease Mother   . Heart disease Father   . Hypertension Father   . Hypertension Brother   . Hyperlipidemia Brother     ROS:  Pertinent items are noted in HPI.  Otherwise, a comprehensive ROS was negative.  Exam:   BP (!) 160/90 (BP Location: Right Arm, Patient Position: Sitting, Cuff Size: Normal)   Pulse 64   Resp 14   Ht 5' 0.75" (1.543 m)   Wt 138 lb (62.6 kg)   LMP 01/11/1992   BMI 26.29 kg/m  Height: 5' 0.75" (154.3 cm) Ht Readings from Last 3 Encounters:  07/12/16 5' 0.75" (1.543 m)  07/07/15 5\' 1"  (1.549 m)  06/30/14 5\' 1"  (1.549 m)    General appearance: alert, cooperative and appears stated age Head: Normocephalic, without obvious abnormality, atraumatic Neck: no  adenopathy, supple, symmetrical, trachea midline and thyroid normal to inspection and palpation Lungs: clear to auscultation bilaterally Breasts: normal appearance, no masses or tenderness Heart: regular rate and rhythm Abdomen: soft, non-tender; no masses,  no organomegaly Extremities: extremities normal, atraumatic, no cyanosis or edema Skin: Skin color, texture, turgor normal. No rashes or lesions Lymph nodes: Cervical, supraclavicular, and axillary nodes normal. No abnormal inguinal nodes palpated Neurologic: Grossly normal   Pelvic: External genitalia:  no lesions              Urethra:  normal appearing urethra with no masses, tenderness or lesions              Bartholin's and Skene's: normal                 Vagina: normal appearing vagina with normal color and  discharge, no lesions              Cervix: absent              Pap taken: No. Bimanual Exam:  Uterus:  uterus absent              Adnexa: no mass, fullness, tenderness               Rectovaginal: Confirms               Anus:  normal sphincter tone, no lesions  Chaperone present: no  A:  Well Woman with normal exam  Postmenopausal on ERT since 12/2008 - now off since 09/2013 S/P TVH and AP repair 1993  Diagnosis of Brain Meningioma and had radiation treatments 2014- (found secondary to double vision right eye)    P:   Reviewed health and wellness pertinent to exam  Pap smear as above  Mammogram is due 12/17  IFOB is given  Follow with labs  Counseled on breast self exam, mammography screening, adequate intake of calcium and vitamin D, diet and exercise, Kegel's exercises return annually or prn  An After Visit Summary was printed and given to the patient.

## 2016-07-12 NOTE — Patient Instructions (Signed)

## 2016-07-12 NOTE — Progress Notes (Signed)
Encounter reviewed by Dr. Yasira Engelson Amundson C. Silva.  

## 2016-07-13 LAB — HEPATITIS C ANTIBODY: HCV AB: NEGATIVE

## 2016-07-13 LAB — HIV ANTIBODY (ROUTINE TESTING W REFLEX): HIV: NONREACTIVE

## 2016-09-20 ENCOUNTER — Encounter: Payer: Self-pay | Admitting: *Deleted

## 2016-09-20 NOTE — Progress Notes (Signed)
Letter sent via MyChart requesting return of IFOB.  Order extended until 10/21/16.

## 2016-09-22 ENCOUNTER — Encounter: Payer: Self-pay | Admitting: Nurse Practitioner

## 2016-10-03 ENCOUNTER — Other Ambulatory Visit: Payer: Self-pay | Admitting: Nurse Practitioner

## 2016-10-03 DIAGNOSIS — Z1231 Encounter for screening mammogram for malignant neoplasm of breast: Secondary | ICD-10-CM

## 2016-10-03 LAB — FECAL OCCULT BLOOD, IMMUNOCHEMICAL: IFOBT: NEGATIVE

## 2016-10-03 NOTE — Addendum Note (Signed)
Addended by: Zoila Shutter D on: 10/03/2016 08:44 AM   Modules accepted: Orders

## 2016-10-26 ENCOUNTER — Ambulatory Visit
Admission: RE | Admit: 2016-10-26 | Discharge: 2016-10-26 | Disposition: A | Discharge status: Home / Self Care | Payer: BLUE CROSS/BLUE SHIELD | Source: Ambulatory Visit | Attending: Nurse Practitioner | Admitting: Nurse Practitioner

## 2016-10-26 DIAGNOSIS — Z1231 Encounter for screening mammogram for malignant neoplasm of breast: Secondary | ICD-10-CM | POA: Diagnosis not present

## 2016-11-21 DIAGNOSIS — E785 Hyperlipidemia, unspecified: Secondary | ICD-10-CM | POA: Diagnosis not present

## 2016-11-21 DIAGNOSIS — E7800 Pure hypercholesterolemia, unspecified: Secondary | ICD-10-CM | POA: Diagnosis not present

## 2016-11-21 DIAGNOSIS — I10 Essential (primary) hypertension: Secondary | ICD-10-CM | POA: Diagnosis not present

## 2017-04-04 ENCOUNTER — Telehealth: Payer: Self-pay | Admitting: Obstetrics & Gynecology

## 2017-04-04 NOTE — Telephone Encounter (Signed)
Left message for patient to reschedule Stephanie Beard appt. °

## 2017-05-02 DIAGNOSIS — H6123 Impacted cerumen, bilateral: Secondary | ICD-10-CM | POA: Diagnosis not present

## 2017-06-21 DIAGNOSIS — Z23 Encounter for immunization: Secondary | ICD-10-CM | POA: Diagnosis not present

## 2017-06-22 ENCOUNTER — Other Ambulatory Visit: Payer: Self-pay | Admitting: Radiation Therapy

## 2017-06-22 DIAGNOSIS — D329 Benign neoplasm of meninges, unspecified: Secondary | ICD-10-CM

## 2017-06-28 ENCOUNTER — Ambulatory Visit
Admission: RE | Admit: 2017-06-28 | Discharge: 2017-06-28 | Disposition: A | Discharge status: Home / Self Care | Payer: BLUE CROSS/BLUE SHIELD | Source: Ambulatory Visit | Attending: Radiation Oncology | Admitting: Radiation Oncology

## 2017-06-28 DIAGNOSIS — D329 Benign neoplasm of meninges, unspecified: Secondary | ICD-10-CM

## 2017-06-28 MED ORDER — GADOBENATE DIMEGLUMINE 529 MG/ML IV SOLN
12.0000 mL | Freq: Once | INTRAVENOUS | Status: AC | PRN
  Administered 2017-06-28: 12 mL via INTRAVENOUS

## 2017-06-29 NOTE — Progress Notes (Addendum)
Stephanie Beard  61 year old woman with a right sphenoid wing 2.6 x 1.9 x 1.6 cm meningioma invading the sella encasing the right optic nerve and deviating the chiasm radiation completed 06-19-13,review 06-28-17 MRI brain, FU.   PAIN: He she is currently not having pain. NEURO:Alert and oriented x 3 with fluent speech,able to complete sentences without difficulty with word finding or organization of sentences. Denies any visual disturbance,blurred vision,double vision,blind spots or peripheral vision changes, Denies reports any nausea or vomiting,ataxia,ring of ears,dizziness or headache. Fine motor movement-picking up objects with fingers,holding objects, writing, weakness of lower extremities:  None Aphasia/Slurred speech:No SKIN:Warm and dry. Imaging:06-28-17 MRI brain Lab:No Refused to have her weight taken. BP (!) 138/91   Pulse 73   Temp 98.8 F (37.1 C) (Oral)   Resp 18   Ht 5' 0.75" (1.543 m)   LMP 01/11/1992   SpO2 (!) 18%

## 2017-07-03 ENCOUNTER — Encounter: Payer: Self-pay | Admitting: Radiation Oncology

## 2017-07-03 ENCOUNTER — Ambulatory Visit
Admission: RE | Admit: 2017-07-03 | Discharge: 2017-07-03 | Disposition: A | Discharge status: Home / Self Care | Payer: BLUE CROSS/BLUE SHIELD | Source: Ambulatory Visit | Attending: Radiation Oncology | Admitting: Radiation Oncology

## 2017-07-03 VITALS — BP 138/91 | HR 73 | Temp 98.8°F | Resp 18 | Ht 60.75 in

## 2017-07-03 DIAGNOSIS — D329 Benign neoplasm of meninges, unspecified: Secondary | ICD-10-CM

## 2017-07-03 DIAGNOSIS — Z86011 Personal history of benign neoplasm of the brain: Secondary | ICD-10-CM | POA: Diagnosis not present

## 2017-07-03 DIAGNOSIS — Z08 Encounter for follow-up examination after completed treatment for malignant neoplasm: Secondary | ICD-10-CM | POA: Diagnosis not present

## 2017-07-03 NOTE — Progress Notes (Signed)
Radiation Oncology         [phone] ________________________________  Name: Stephanie Beard MRN: [id number]  Date of Service: 07/03/2017 DOB: [birth year]  Post Treatment Note  CC: Leighton Ruff, MD  Leighton Ruff, MD  Diagnosis:  Right Sphenoid wing meningioma invading the sella.   Interval Since Last Radiation:  4 years  05/09/2013-06/19/2013: The meningioma was conformally treated to 52.2 Gy in 29 fractions of 1.8 Gy  Narrative:  The patient returns today for routine follow-up and has done well without progression or new meningioma since she was treated. Her last MRI on 06/28/17 revealed stability of her previously treated meningioma without evidence of new disease.                               On review of systems, the patient reports that she is doing well overall. She denies any chest pain, shortness of breath, cough, fevers, chills, night sweats, unintended weight changes. She denies any bowel or bladder disturbances, and denies abdominal pain, nausea or vomiting. She denies any new musculoskeletal or joint aches or pains, new skin lesions or concerns. She denies any headaches, changes in visual acuity or in light sensitivity. She denies any dizziness or changes in speech. A complete review of systems is obtained and is otherwise negative.    Past Medical History:  Past Medical History:  Diagnosis Date  . Arthritis    Right knee osteoarthritis  . Hypertension 2012  . Meningioma ([place]) 04/2013   brain MRI(hx. "double vision" and treatment with radiation-last tx. radiation 8'14(no further problems)    Past Surgical History: Past Surgical History:  Procedure Laterality Date  . ABDOMINAL HYSTERECTOMY    . CESAREAN SECTION  1990  . KNEE ARTHROSCOPY Right 08/22/2013   Procedure: RIGHT KNEE ARTHROSCOPY WITH PARTIAL MEDIAL and LATERAL MENISECTOMY/DEBRIDEMENT;  Surgeon: Johnn Hai, MD;  Location: [facility];  Service: Orthopedics;  Laterality: Right;  . TUBAL LIGATION  Bilateral 1992  . VAGINAL HYSTERECTOMY  1993   secondary prolapse w/A&P repair  . WISDOM TOOTH EXTRACTION  1978    Social History:  Social History   Social History  . Marital status: Married    Spouse name: N/A  . Number of children: 2  . Years of education: N/A   Occupational History  . [employer]   Social History Main Topics  . Smoking status: Never Smoker  . Smokeless tobacco: Never Used  . Alcohol use 3.0 oz/week    5 Standard drinks or equivalent per week  . Drug use: No  . Sexual activity: Yes    Partners: Male   Other Topics Concern  . Not on file   Social History Narrative  . No narrative on file  The patient is married and lives in [place]. She has worked at [employer] previously but currently works for [employer].  Family History: Family History  Problem Relation Age of Onset  . Hypertension Mother   . Hypercholesterolemia Mother   . Heart disease Mother   . Heart disease Father   . Hypertension Father   . Hypertension Brother   . Hyperlipidemia Brother     ALLERGIES:  has No Known Allergies.  Meds: Current Outpatient Prescriptions  Medication Sig Dispense Refill  . Ascorbic Acid (VITAMIN C) 1000 MG tablet Take 1,000 mg by mouth daily.    Marland Kitchen aspirin EC 81 MG tablet Take 4 tablets (325 mg total)  by mouth daily.    . calcium-vitamin D 250-100 MG-UNIT per tablet Take 1 tablet by mouth daily.     . Cyanocobalamin (VITAMIN B-12 PO) Take by mouth daily.    . hydrochlorothiazide (HYDRODIURIL) 25 MG tablet Take 25 mg by mouth every morning.     . Multiple Vitamin (MULTIVITAMIN WITH MINERALS) TABS tablet Take 1 tablet by mouth daily.    . Omega-3 Fatty Acids (FISH OIL PO) Take by mouth daily.     No current facility-administered medications for this encounter.     Physical Findings:  height is 5' 0.75" (1.543 m). Her oral temperature is 98.8 F (37.1 C). Her blood pressure is 138/91 (abnormal) and her pulse is 73. Her  respiration is 18 and oxygen saturation is 18% (abnormal).  Pain Assessment Pain Score: 0-No pain/10 In general this is a well appearing Caucasian female in no acute distress. She's alert and oriented x4 and appropriate throughout the examination. Cardiopulmonary assessment is negative for acute distress and she exhibits normal effort. HEENT reveals that she is normocephalic, atraumatic. EOMs are intact bilaterally. PERRLA.   Lab Findings: Lab Results  Component Value Date   WBC 5.0 08/16/2013   HGB 12.6 08/16/2013   HCT 37.0 08/16/2013   MCV 97.9 08/16/2013   PLT 288 08/16/2013     Radiographic Findings: Mr [name] EP Contrast  Result Date: 06/28/2017 CLINICAL DATA:  Meningioma, status post radiation therapy 4 years ago. EXAM: MRI HEAD WITHOUT AND WITH CONTRAST TECHNIQUE: Multiplanar, multiecho pulse sequences of the brain and surrounding structures were obtained without and with intravenous contrast. CONTRAST:  95mL MULTIHANCE GADOBENATE DIMEGLUMINE 529 MG/ML IV SOLN COMPARISON:  Brain MRI 01/21/2016 FINDINGS: Brain: Meningioma centered at the right cavernous sinus is unchanged in size, measuring 3.0 x 1.9 x 2.0 cm (AP x Transverse x CC). The mass again extends to the right orbital apex without visible intraorbital extension. There is no focal diffusion restriction to indicate acute infarct. Scattered foci of hyperintense T2 weighted signal within the juxtacortical white matter bilaterally, unchanged. No intraparenchymal hematoma or chronic microhemorrhage. Brain volume is normal for age without lobar predominant atrophy. The dura is normal and there is no extra-axial collection. Vascular: Major intracranial arterial and venous sinus flow voids are preserved.The flow void of the right cavernous internal carotid artery is preserved. The artery remains encased by the mass Skull and upper cervical spine: The visualized skull base, calvarium, upper cervical spine and extracranial soft tissues are  normal. Sinuses/Orbits: No fluid levels or advanced mucosal thickening. No mastoid or middle ear effusion. Normal orbits. IMPRESSION: 1. Unchanged size and appearance of right cavernous sinus meningioma that extends to the right orbital apex. 2. Unchanged distribution of nonspecific white matter lesions. Electronically Signed   By: Ulyses Jarred M.D.   On: 06/28/2017 14:47    Impression/Plan: 1. 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 The patient continues to be clinically and radiographically doing well. She will continue to follow up with either our department or neuro oncology in 1 year's time. She is given the option and will decide if she'd like to see me or Dr. Mickeal Skinner next year. In the meantime she's counseled on continuing routine eye care with opthalmology. She is in agreement and will keep [place] informed of any questions or concerns.     Carola Rhine, PAC

## 2017-07-05 ENCOUNTER — Telehealth: Payer: Self-pay | Admitting: Certified Nurse Midwife

## 2017-07-05 DIAGNOSIS — Z0000 Encounter for general adult medical examination without abnormal findings: Secondary | ICD-10-CM

## 2017-07-05 NOTE — Telephone Encounter (Signed)
Patient requested to have fasting lab work before aex on 07/26/17. Scheduled for 07/17/17 at 8:30. Please enter orders. Thank you.

## 2017-07-05 NOTE — Telephone Encounter (Signed)
Stephanie Beard, CNM -see message below and advise on orders? BMP, CBC, Lipid panel, Vit D, TSH   Last AEX 07/12/16 with [name], NP. Next AEX 07/26/17 with Stephanie Beard, CNM.

## 2017-07-06 NOTE — Telephone Encounter (Signed)
Agreeable, I think you meant CMP

## 2017-07-06 NOTE — Telephone Encounter (Signed)
Future orders placed for CBC, CMP, Lipid profile, TSH, Vitamin D.   Routing to provider for final review.  Will close encounter.

## 2017-07-17 ENCOUNTER — Other Ambulatory Visit (INDEPENDENT_AMBULATORY_CARE_PROVIDER_SITE_OTHER): Payer: BLUE CROSS/BLUE SHIELD

## 2017-07-17 DIAGNOSIS — Z0000 Encounter for general adult medical examination without abnormal findings: Secondary | ICD-10-CM | POA: Diagnosis not present

## 2017-07-18 LAB — CBC
Hematocrit: 44.1 % (ref 34.0–46.6)
Hemoglobin: 14.6 g/dL (ref 11.1–15.9)
MCH: 33.5 pg — ABNORMAL HIGH (ref 26.6–33.0)
MCHC: 33.1 g/dL (ref 31.5–35.7)
MCV: 101 fL — ABNORMAL HIGH (ref 79–97)
PLATELETS: 260 10*3/uL (ref 150–379)
RBC: 4.36 x10E6/uL (ref 3.77–5.28)
RDW: 13.4 % (ref 12.3–15.4)
WBC: 3.5 10*3/uL (ref 3.4–10.8)

## 2017-07-18 LAB — COMPREHENSIVE METABOLIC PANEL
ALK PHOS: 53 IU/L (ref 39–117)
ALT: 18 IU/L (ref 0–32)
AST: 26 IU/L (ref 0–40)
Albumin/Globulin Ratio: 1.8 (ref 1.2–2.2)
Albumin: 4.4 g/dL (ref 3.6–4.8)
BILIRUBIN TOTAL: 0.6 mg/dL (ref 0.0–1.2)
BUN/Creatinine Ratio: 12 (ref 12–28)
BUN: 8 mg/dL (ref 8–27)
CHLORIDE: 100 mmol/L (ref 96–106)
CO2: 24 mmol/L (ref 20–29)
Calcium: 9.8 mg/dL (ref 8.7–10.3)
Creatinine, Ser: 0.69 mg/dL (ref 0.57–1.00)
GFR calc Af Amer: 109 mL/min/{1.73_m2} (ref >59)
GFR calc non Af Amer: 95 mL/min/{1.73_m2} (ref >59)
GLUCOSE: 96 mg/dL (ref 65–99)
Globulin, Total: 2.5 g/dL (ref 1.5–4.5)
Potassium: 3.8 mmol/L (ref 3.5–5.2)
Sodium: 141 mmol/L (ref 134–144)
TOTAL PROTEIN: 6.9 g/dL (ref 6.0–8.5)

## 2017-07-18 LAB — LIPID PANEL
CHOL/HDL RATIO: 3.1 ratio (ref 0.0–4.4)
Cholesterol, Total: 267 mg/dL — ABNORMAL HIGH (ref 100–199)
HDL: 85 mg/dL (ref >39)
LDL Calculated: 163 mg/dL — ABNORMAL HIGH (ref 0–99)
Triglycerides: 95 mg/dL (ref 0–149)
VLDL Cholesterol Cal: 19 mg/dL (ref 5–40)

## 2017-07-18 LAB — TSH: TSH: 1.85 u[IU]/mL (ref 0.450–4.500)

## 2017-07-18 LAB — VITAMIN D 25 HYDROXY (VIT D DEFICIENCY, FRACTURES): Vit D, 25-Hydroxy: 39.7 ng/mL (ref 30.0–100.0)

## 2017-07-24 ENCOUNTER — Encounter: Payer: Self-pay | Admitting: Certified Nurse Midwife

## 2017-07-24 ENCOUNTER — Ambulatory Visit: Payer: BLUE CROSS/BLUE SHIELD | Admitting: Nurse Practitioner

## 2017-07-24 ENCOUNTER — Telehealth: Payer: Self-pay | Admitting: *Deleted

## 2017-07-24 NOTE — Telephone Encounter (Signed)
Stephanie Beard, CNM -see MyChart message below [place]. Will close encounter.   From Monico Blitz "Barb" To [name], CNM Sent 07/24/2017 9:56 AM  Hello - I have a form that I will be bringing to my appointment. I just need a signature and a date for HR so I can pay the discounted premium for my health insurance. Just a heads up. Thank you - Stephanie Beard

## 2017-07-26 ENCOUNTER — Ambulatory Visit (INDEPENDENT_AMBULATORY_CARE_PROVIDER_SITE_OTHER): Payer: BLUE CROSS/BLUE SHIELD | Admitting: Certified Nurse Midwife

## 2017-07-26 ENCOUNTER — Encounter: Payer: Self-pay | Admitting: Certified Nurse Midwife

## 2017-07-26 ENCOUNTER — Other Ambulatory Visit: Payer: Self-pay

## 2017-07-26 VITALS — BP 140/80 | HR 70 | Resp 16 | Ht 60.5 in | Wt 144.0 lb

## 2017-07-26 DIAGNOSIS — Z01419 Encounter for gynecological examination (general) (routine) without abnormal findings: Secondary | ICD-10-CM | POA: Diagnosis not present

## 2017-07-26 DIAGNOSIS — N952 Postmenopausal atrophic vaginitis: Secondary | ICD-10-CM | POA: Diagnosis not present

## 2017-07-26 NOTE — Patient Instructions (Signed)
EXERCISE AND DIET:  We recommended that you start or continue a regular exercise program for good health. Regular exercise means any activity that makes your heart beat faster and makes you sweat.  We recommend exercising at least 30 minutes per day at least 3 days a week, preferably 4 or 5.  We also recommend a diet low in fat and sugar.  Inactivity, poor dietary choices and obesity can cause diabetes, heart attack, stroke, and kidney damage, among others.    ALCOHOL AND SMOKING:  Women should limit their alcohol intake to no more than 7 drinks/beers/glasses of wine (combined, not each!) per week. Moderation of alcohol intake to this level decreases your risk of breast cancer and liver damage. And of course, no recreational drugs are part of a healthy lifestyle.  And absolutely no smoking or even second hand smoke. Most people know smoking can cause heart and lung diseases, but did you know it also contributes to weakening of your bones? Aging of your skin?  Yellowing of your teeth and nails?  CALCIUM AND VITAMIN D:  Adequate intake of calcium and Vitamin D are recommended.  The recommendations for exact amounts of these supplements seem to change often, but generally speaking 600 mg of calcium (either carbonate or citrate) and 800 units of Vitamin D per day seems prudent. Certain women may benefit from higher intake of Vitamin D.  If you are among these women, your doctor will have told you during your visit.    PAP SMEARS:  Pap smears, to check for cervical cancer or precancers,  have traditionally been done yearly, although recent scientific advances have shown that most women can have pap smears less often.  However, every woman still should have a physical exam from her gynecologist every year. It will include a breast check, inspection of the vulva and vagina to check for abnormal growths or skin changes, a visual exam of the cervix, and then an exam to evaluate the size and shape of the uterus and  ovaries.  And after 60 years of age, a rectal exam is indicated to check for rectal cancers. We will also provide age appropriate advice regarding health maintenance, like when you should have certain vaccines, screening for sexually transmitted diseases, bone density testing, colonoscopy, mammograms, etc.   MAMMOGRAMS:  All women over 40 years old should have a yearly mammogram. Many facilities now offer a "3D" mammogram, which may cost around $50 extra out of pocket. If possible,  we recommend you accept the option to have the 3D mammogram performed.  It both reduces the number of women who will be called back for extra views which then turn out to be normal, and it is better than the routine mammogram at detecting truly abnormal areas.    COLONOSCOPY:  Colonoscopy to screen for colon cancer is recommended for all women at age 50.  We know, you hate the idea of the prep.  We agree, BUT, having colon cancer and not knowing it is worse!!  Colon cancer so often starts as a polyp that can be seen and removed at colonscopy, which can quite literally save your life!  And if your first colonoscopy is normal and you have no family history of colon cancer, most women don't have to have it again for 10 years.  Once every ten years, you can do something that may end up saving your life, right?  We will be happy to help you get it scheduled when you are ready.    Be sure to check your insurance coverage so you understand how much it will cost.  It may be covered as a preventative service at no cost, but you should check your particular policy.      Atrophic Vaginitis Atrophic vaginitis is when the tissues that line the vagina become dry and thin. This is caused by a drop in estrogen. Estrogen helps:  To keep the vagina moist.  To make a clear fluid that helps: ? To lubricate the vagina for sex. ? To protect the vagina from infection.  If the lining of the vagina is dry and thin, it may:  Make sex painful. It  may also cause bleeding.  Cause a feeling of: ? Burning. ? Irritation. ? Itchiness.  Make an exam of your vagina painful. It may also cause bleeding.  Make you lose interest in sex.  Cause a burning feeling when you pee.  Make your vaginal fluid (discharge) brown or yellow.  For some women, there are no symptoms. This condition is most common in women who do not get their regular menstrual periods anymore (menopause). This often starts when a woman is 45-55 years old. Follow these instructions at home:  Take medicines only as told by your doctor. Do not use any herbal or alternative medicines unless your doctor says it is okay.  Use over-the-counter products for dryness only as told by your doctor. These include: ? Creams. ? Lubricants. ? Moisturizers.  Do not douche.  Do not use products that can make your vagina dry. These include: ? Scented feminine sprays. ? Scented tampons. ? Scented soaps.  If it hurts to have sex, tell your sexual partner. Contact a doctor if:  Your discharge looks different than normal.  Your vagina has an unusual smell.  You have new symptoms.  Your symptoms do not get better with treatment.  Your symptoms get worse. This information is not intended to replace advice given to you by your health care provider. Make sure you discuss any questions you have with your health care provider. Document Released: 02/15/2008 Document Revised: 02/04/2016 Document Reviewed: 08/20/2014 Elsevier Interactive Patient Education  2018 Elsevier Inc.  

## 2017-07-26 NOTE — Progress Notes (Signed)
61 y.o. G13P2002 Married  Caucasian Fe here for annual exam. Sees PCP for aex/labs/hypertension management. Interested in Shingles vaccine, plans to have with PCP. Had injured left knee with running so has decreased activity and some weight gain. Working on weight loss now. Some vaginal dryness, using OTC product with good results. No health issues today. Had labs prior to appointment and was given results per phone, no questions today. Needs form for work that she has had exam.  Patient's last menstrual period was 01/11/1992.          Sexually active: Yes.    The current method of family planning is status post hysterectomy.    Exercising: Yes.    run, walk Smoker:  no  Health Maintenance: Pap:  2011 neg History of Abnormal Pap: no MMG:  10-26-16 category c density birads 1:neg Self Breast exams: yes Colonoscopy:  2010 f/u 90yrs BMD:   2010 normal, PCP manages TDaP:  2009 Shingles: had done Pneumonia: had done Hep C and HIV: both neg 2017 Labs: already done   reports that  has never smoked. she has never used smokeless tobacco. She reports that she drinks about 3.0 oz of alcohol per week. She reports that she does not use drugs.  Past Medical History:  Diagnosis Date  . Arthritis    Right knee osteoarthritis  . Hypertension 2012  . Meningioma ([place]) 04/2013   brain MRI(hx. "double vision" and treatment with radiation-last tx. radiation 8'14(no further problems)    Past Surgical History:  Procedure Laterality Date  . ABDOMINAL HYSTERECTOMY    . CESAREAN SECTION  1990  . TUBAL LIGATION Bilateral 1992  . VAGINAL HYSTERECTOMY  1993   secondary prolapse w/A&P repair  . WISDOM TOOTH EXTRACTION  1978    Current Outpatient Medications  Medication Sig Dispense Refill  . Ascorbic Acid (VITAMIN C) 1000 MG tablet Take 1,000 mg by mouth daily.    Marland Kitchen aspirin EC 81 MG tablet Take 4 tablets (325 mg total) by mouth daily.    . calcium-vitamin D 250-100 MG-UNIT per tablet Take 1 tablet by  mouth daily.     . Cyanocobalamin (VITAMIN B-12 PO) Take by mouth daily.    . hydrochlorothiazide (HYDRODIURIL) 25 MG tablet Take 25 mg by mouth every morning.     . Multiple Vitamin (MULTIVITAMIN WITH MINERALS) TABS tablet Take 1 tablet by mouth daily.    . Omega-3 Fatty Acids (FISH OIL PO) Take by mouth daily.     No current facility-administered medications for this visit.     Family History  Problem Relation Age of Onset  . Hypertension Mother   . Hypercholesterolemia Mother   . Heart disease Mother   . Heart disease Father   . Hypertension Father   . Hypertension Brother   . Hyperlipidemia Brother     ROS:  Pertinent items are noted in HPI.  Otherwise, a comprehensive ROS was negative.  Exam:   BP 140/80   Pulse 70   Resp 16   Ht 5' 0.5" (1.537 m)   Wt 144 lb (65.3 kg)   LMP 01/11/1992   BMI 27.66 kg/m  Height: 5' 0.5" (153.7 cm) Ht Readings from Last 3 Encounters:  07/26/17 5' 0.5" (1.537 m)  07/03/17 5' 0.75" (1.543 m)  07/12/16 5' 0.75" (1.543 m)    General appearance: alert, cooperative and appears stated age Head: Normocephalic, without obvious abnormality, atraumatic Neck: no adenopathy, supple, symmetrical, trachea midline and thyroid normal to inspection and palpation Lungs:  clear to auscultation bilaterally Breasts: normal appearance, no masses or tenderness, No nipple retraction or dimpling, No nipple discharge or bleeding, No axillary or supraclavicular adenopathy Heart: regular rate and rhythm Abdomen: soft, non-tender; no masses,  no organomegaly Extremities: extremities normal, atraumatic, no cyanosis or edema Skin: Skin color, texture, turgor normal. No rashes or lesions Lymph nodes: Cervical, supraclavicular, and axillary nodes normal. No abnormal inguinal nodes palpated Neurologic: Grossly normal   Pelvic: External genitalia:  no lesions              Urethra:  normal appearing urethra with no masses, tenderness or lesions               Bartholin's and Skene's: normal                 Vagina: atrophicl appearing vagina with pale color and scant discharge, no lesions, non tender              Cervix: absent              Pap taken: No. Bimanual Exam:  Uterus:  uterus absent              Adnexa: normal adnexa and no mass, fullness, tenderness               Rectovaginal: Confirms               Anus:  normal sphincter tone, no lesions  Chaperone present: yes  A:  Well Woman with normal exam  Menopausal no HRT s/p TAH for bleeding ovaries retained  Atrophic vaginitis  Hypertension with PCP management, all stable per patient  P:   Reviewed health and wellness pertinent to exam  Aware of need to advise if vaginal bleeding or vaginal dryness not responding to OTC product.  Discussed Atrophic vaginitis finding and etiology. Discussed more frequent use of vaginal moisture or Estrogen product. Patient will consider and call if needs assistance. Questions addressed  Form completed and signed copy for chart and original given to patient.  Pap smear: no   counseled on breast self exam, mammography screening, feminine hygiene, adequate intake of calcium and vitamin D, diet and exercise  return annually or prn  An After Visit Summary was printed and given to the patient.

## 2017-10-12 DIAGNOSIS — I10 Essential (primary) hypertension: Secondary | ICD-10-CM | POA: Diagnosis not present

## 2017-10-12 DIAGNOSIS — E7800 Pure hypercholesterolemia, unspecified: Secondary | ICD-10-CM | POA: Diagnosis not present

## 2017-11-01 DIAGNOSIS — L821 Other seborrheic keratosis: Secondary | ICD-10-CM | POA: Diagnosis not present

## 2017-11-01 DIAGNOSIS — Z1283 Encounter for screening for malignant neoplasm of skin: Secondary | ICD-10-CM | POA: Diagnosis not present

## 2017-11-01 DIAGNOSIS — L820 Inflamed seborrheic keratosis: Secondary | ICD-10-CM | POA: Diagnosis not present

## 2017-11-28 ENCOUNTER — Other Ambulatory Visit: Payer: Self-pay | Admitting: Certified Nurse Midwife

## 2017-11-28 DIAGNOSIS — Z1231 Encounter for screening mammogram for malignant neoplasm of breast: Secondary | ICD-10-CM

## 2017-12-06 DIAGNOSIS — D225 Melanocytic nevi of trunk: Secondary | ICD-10-CM | POA: Diagnosis not present

## 2017-12-06 DIAGNOSIS — D485 Neoplasm of uncertain behavior of skin: Secondary | ICD-10-CM | POA: Diagnosis not present

## 2017-12-06 DIAGNOSIS — L918 Other hypertrophic disorders of the skin: Secondary | ICD-10-CM | POA: Diagnosis not present

## 2017-12-11 DIAGNOSIS — M25562 Pain in left knee: Secondary | ICD-10-CM | POA: Diagnosis not present

## 2017-12-11 DIAGNOSIS — M1712 Unilateral primary osteoarthritis, left knee: Secondary | ICD-10-CM | POA: Diagnosis not present

## 2017-12-18 ENCOUNTER — Ambulatory Visit
Admission: RE | Admit: 2017-12-18 | Discharge: 2017-12-18 | Disposition: A | Discharge status: Home / Self Care | Payer: BLUE CROSS/BLUE SHIELD | Source: Ambulatory Visit | Attending: Certified Nurse Midwife | Admitting: Certified Nurse Midwife

## 2017-12-18 DIAGNOSIS — Z1231 Encounter for screening mammogram for malignant neoplasm of breast: Secondary | ICD-10-CM

## 2017-12-20 ENCOUNTER — Other Ambulatory Visit: Payer: Self-pay | Admitting: Certified Nurse Midwife

## 2017-12-20 DIAGNOSIS — R928 Other abnormal and inconclusive findings on diagnostic imaging of breast: Secondary | ICD-10-CM

## 2017-12-26 ENCOUNTER — Ambulatory Visit: Payer: BLUE CROSS/BLUE SHIELD

## 2017-12-26 ENCOUNTER — Ambulatory Visit
Admission: RE | Admit: 2017-12-26 | Discharge: 2017-12-26 | Disposition: A | Discharge status: Home / Self Care | Payer: BLUE CROSS/BLUE SHIELD | Source: Ambulatory Visit | Attending: Certified Nurse Midwife | Admitting: Certified Nurse Midwife

## 2017-12-26 DIAGNOSIS — R921 Mammographic calcification found on diagnostic imaging of breast: Secondary | ICD-10-CM | POA: Diagnosis not present

## 2017-12-26 DIAGNOSIS — R928 Other abnormal and inconclusive findings on diagnostic imaging of breast: Secondary | ICD-10-CM

## 2018-03-20 DIAGNOSIS — H8110 Benign paroxysmal vertigo, unspecified ear: Secondary | ICD-10-CM | POA: Insufficient documentation

## 2018-03-20 DIAGNOSIS — R42 Dizziness and giddiness: Secondary | ICD-10-CM | POA: Diagnosis not present

## 2018-03-20 DIAGNOSIS — H6123 Impacted cerumen, bilateral: Secondary | ICD-10-CM | POA: Diagnosis not present

## 2018-03-30 DIAGNOSIS — H8113 Benign paroxysmal vertigo, bilateral: Secondary | ICD-10-CM | POA: Diagnosis not present

## 2018-05-01 ENCOUNTER — Ambulatory Visit: Payer: BLUE CROSS/BLUE SHIELD | Attending: Otolaryngology | Admitting: Physical Therapy

## 2018-05-01 DIAGNOSIS — H8113 Benign paroxysmal vertigo, bilateral: Secondary | ICD-10-CM

## 2018-05-01 NOTE — Patient Instructions (Addendum)
How to Perform the Epley Maneuver The Epley maneuver is an exercise that relieves symptoms of vertigo. Vertigo is the feeling that you or your surroundings are moving when they are not. When you feel vertigo, you may feel like the room is spinning and have trouble walking. Dizziness is a little different than vertigo. When you are dizzy, you may feel unsteady or light-headed. You can do this maneuver at home whenever you have symptoms of vertigo. You can do it up to 3 times a day until your symptoms go away. Even though the Epley maneuver may relieve your vertigo for a few weeks, it is possible that your symptoms will return. This maneuver relieves vertigo, but it does not relieve dizziness. What are the risks? If it is done correctly, the Epley maneuver is considered safe. Sometimes it can lead to dizziness or nausea that goes away after a short time. If you develop other symptoms, such as changes in vision, weakness, or numbness, stop doing the maneuver and call your health care provider. How to perform the Epley maneuver 1. Sit on the edge of a bed or table with your back straight and your legs extended or hanging over the edge of the bed or table. 2. Turn your head halfway toward the affected ear or side. 3. Lie backward quickly with your head turned until you are lying flat on your back. You may want to position a pillow under your shoulders. 4. Hold this position for 30 seconds. You may experience an attack of vertigo. This is normal. 5. Turn your head to the opposite direction until your unaffected ear is facing the floor. 6. Hold this position for 30 seconds. You may experience an attack of vertigo. This is normal. Hold this position until the vertigo stops. 7. Turn your whole body to the same side as your head. Hold for another 30 seconds. 8. Sit back up. You can repeat this exercise up to 3 times a day. Follow these instructions at home:  After doing the Epley maneuver, you can return to  your normal activities.  Ask your health care provider if there is anything you should do at home to prevent vertigo. He or she may recommend that you: ? Keep your head raised (elevated) with two or more pillows while you sleep. ? Do not sleep on the side of your affected ear. ? Get up slowly from bed. ? Avoid sudden movements during the day. ? Avoid extreme head movement, like looking up or bending over. Contact a health care provider if:  Your vertigo gets worse.  You have other symptoms, including: ? Nausea. ? Vomiting. ? Headache. Get help right away if:  You have vision changes.  You have a severe or worsening headache or neck pain.  You cannot stop vomiting.  You have new numbness or weakness in any part of your body. Summary  Vertigo is the feeling that you or your surroundings are moving when they are not.  The Epley maneuver is an exercise that relieves symptoms of vertigo.  If the Epley maneuver is done correctly, it is considered safe. You can do it up to 3 times a day. This information is not intended to replace advice given to you by your health care provider. Make sure you discuss any questions you have with your health care provider. Document Released: 09/03/2013 Document Revised: 07/19/2016 Document Reviewed: 07/19/2016 Elsevier Interactive Patient Education  2017 [place] for Right Posterior / Anterior Canalithiasis  Sitting on bed: 1. Turn head 45 right. (a) Lie back slowly, shoulders on pillow, head on bed. (b) Hold _30___ seconds. 2. Keeping head on bed, turn head 90 left. Hold _30___ seconds. 3. Roll to left, head on 45 angle down toward bed. Hold _30___ seconds. 4. Sit up on left side of bed. Repeat __3__ times per session. Do _1-2___ sessions per day.   Self Treatment for Left Posterior / Anterior Canalithiasis    Sitting on bed: 1. Turn head 45 left. (a) Lie back slowly, shoulders on pillow, head on bed. (b) Hold _30___  seconds. 2. Keeping head on bed, turn head 90 right. Hold _30___ seconds. 3. Roll to right, head on 45 angle down toward bed. Hold __30__ seconds. 4. Sit up on right side of bed. Repeat _3___ times per session. Do _1-2___ sessions per day.  Copyright  VHI. All rights reserved.

## 2018-05-02 ENCOUNTER — Encounter: Payer: Self-pay | Admitting: Physical Therapy

## 2018-05-02 ENCOUNTER — Other Ambulatory Visit: Payer: Self-pay

## 2018-05-02 NOTE — Therapy (Signed)
[facility] [address] [place], [place], [address] Phone: [phone]   Fax:  [phone]  Physical Therapy Evaluation  Patient Details  Name: Stephanie Beard MRN: [id number] Date of Birth: 1956-01-08 Referring Provider: Dr. Izora Gala   Encounter Date: 05/01/2018  PT End of Session - 05/02/18 2144    Visit Number  1    Number of Visits  2    Date for PT Re-Evaluation  06/01/18    Authorization Type  [employer]    Authorization - Number of Visits  20    PT Start Time  1324    PT Stop Time  1610    PT Time Calculation (min)  40 min       Past Medical History:  Diagnosis Date  . Arthritis    Right knee osteoarthritis  . Hypertension 2012  . Meningioma ([place]) 04/2013   brain MRI(hx. "double vision" and treatment with radiation-last tx. radiation 8'14(no further problems)    Past Surgical History:  Procedure Laterality Date  . ABDOMINAL HYSTERECTOMY    . CESAREAN SECTION  1990  . KNEE ARTHROSCOPY Right 08/22/2013   Procedure: RIGHT KNEE ARTHROSCOPY WITH PARTIAL MEDIAL and LATERAL MENISECTOMY/DEBRIDEMENT;  Surgeon: Johnn Hai, MD;  Location: [facility];  Service: Orthopedics;  Laterality: Right;  . TUBAL LIGATION Bilateral 1992  . VAGINAL HYSTERECTOMY  1993   secondary prolapse w/A&P repair  . WISDOM TOOTH EXTRACTION  1978    There were no vitals filed for this visit.   Subjective Assessment - 05/02/18 2137    Subjective  Pt reports she has had vertigo intermittently for past 10 years; states she gets dizzy when she lies down and turns her head to right side    Patient Stated Goals  resolve the vertigo    Currently in Pain?  No/denies         [facility] PT Assessment - 05/02/18 0001      Assessment   Medical Diagnosis  BPPV    Referring Provider  Dr. Izora Gala    Onset Date/Surgical Date  --   approx. 2010      Precautions   Precautions  None      Balance Screen   Has the patient fallen in the past  6 months  No    Has the patient had a decrease in activity level because of a fear of falling?   No    Is the patient reluctant to leave their home because of a fear of falling?   No           Vestibular Assessment - 05/02/18 0001      Vestibular Assessment   General Observation  pt is a 62 yr old lady with c/o vertigo which has occurred intermittently for past 10 years       Symptom Behavior   Type of Dizziness  Spinning    Frequency of Dizziness  varies    Duration of Dizziness  secs to minutes    Aggravating Factors  Turning head sideways    Relieving Factors  Lying supine      Occulomotor Exam   Occulomotor Alignment  Normal    Spontaneous  Absent      Positional Testing   Dix-Hallpike  Dix-Hallpike Right;Dix-Hallpike Left    Sidelying Test  Sidelying Right;Sidelying Left      Dix-Hallpike Right   Dix-Hallpike Right Duration  approx. 10 secs    Dix-Hallpike Right Symptoms  Upbeat, right rotatory  nystagmus      Dix-Hallpike Left   Dix-Hallpike Left Duration  approx. 8 secs    Dix-Hallpike Left Symptoms  Upbeat, left rotatory nystagmus      Sidelying Right   Sidelying Right Duration  none    Sidelying Right Symptoms  No nystagmus      Sidelying Left   Sidelying Left Duration  none    Sidelying Left Symptoms  No nystagmus        Epley maneuver performed 2 reps on both right and left sides for rx of posterior canalithiasis;  No nystagmus noted on 2nd rep on either Rt or Lt side - pt had  No c/o vertigo on 2nd rep of Epley on either side            PT Education - 05/02/18 2143    Education Details  Epley maneuver for self treatment prn; info on etiology of BPPV    Person(s) Educated  Patient    Methods  Explanation;Handout;Demonstration    Comprehension  Verbalized understanding;Returned demonstration          PT Long Term Goals - 05/02/18 2149      PT LONG TERM GOAL #1   Title  Pt will report no vertigo with any bed mobility or with  ambulation.    Time  4    Period  Weeks    Status  New    Target Date  06/01/18      PT LONG TERM GOAL #2   Title  Pt will have negative Rt and Lt Dix-Hallpike tests to indicate resolution of bil. BPPV.    Time  4    Period  Weeks    Status  New    Target Date  06/01/18      PT LONG TERM GOAL #3   Title  Verbalize understanding of Epley maneuver for self treatment of BPPV prn.    Time  4    Period  Weeks    Status  New    Target Date  06/01/18             Plan - 05/02/18 2146    Clinical Impression Statement  Pt is a 62 yr old lady who presents with signs and symptoms consistent with bilateral posterior canalithiasis with nystagmus noted in both Rt and Lt Dix-Hallpike positions.  Pt was treated with Epley manuever for both Rt and Lt BPPV (2 reps performed on each side) with resolution appearing to be achieved on 2nd rep for each side with no c/o vertigo and no nystagmus noted on either side on 2nd rep.      History and Personal Factors relevant to plan of care:  pt reports long h/o vertigo    Clinical Presentation  Stable    Clinical Presentation due to:  bil. BPPV     Clinical Decision Making  Low    Rehab Potential  Good    PT Frequency  1x / week    PT Duration  2 weeks    PT Treatment/Interventions  Vestibular;Canalith Repostioning;ADLs/Self Care Home Management;Patient/family education    PT Next Visit Plan  recheck BPPV     PT Home Exercise Plan  Epley maneuver prn    Consulted and Agree with Plan of Care  Patient       Patient will benefit from skilled therapeutic intervention in order to improve the following deficits and impairments:  Dizziness  Visit Diagnosis: BPPV (benign paroxysmal positional vertigo), bilateral - Plan: PT plan  of care cert/re-cert     Problem List Patient Active Problem List   Diagnosis Date Noted  . Tear of medial meniscus of right knee 08/22/2013  . Right knee DJD 08/22/2013  . Meningioma ([place]) 04/16/2013  . Hamstring strain  06/28/2012  . Heel pain 04/14/2011  . Plantar fasciitis 04/14/2011  . 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 [place], [place], [address] Phone: [phone]   Fax:  [phone]  Name: Stephanie Beard MRN: [id number] Date of Birth: [birth year]

## 2018-05-22 ENCOUNTER — Encounter: Payer: BLUE CROSS/BLUE SHIELD | Admitting: Physical Therapy

## 2018-05-22 DIAGNOSIS — I10 Essential (primary) hypertension: Secondary | ICD-10-CM | POA: Diagnosis not present

## 2018-05-22 DIAGNOSIS — E7800 Pure hypercholesterolemia, unspecified: Secondary | ICD-10-CM | POA: Diagnosis not present

## 2018-05-31 DIAGNOSIS — E871 Hypo-osmolality and hyponatremia: Secondary | ICD-10-CM | POA: Diagnosis not present

## 2018-06-01 DIAGNOSIS — E871 Hypo-osmolality and hyponatremia: Secondary | ICD-10-CM | POA: Diagnosis not present

## 2018-06-11 DIAGNOSIS — Z23 Encounter for immunization: Secondary | ICD-10-CM | POA: Diagnosis not present

## 2018-06-19 DIAGNOSIS — E871 Hypo-osmolality and hyponatremia: Secondary | ICD-10-CM | POA: Diagnosis not present

## 2018-07-09 ENCOUNTER — Other Ambulatory Visit: Payer: Self-pay | Admitting: Radiation Therapy

## 2018-07-09 DIAGNOSIS — D329 Benign neoplasm of meninges, unspecified: Secondary | ICD-10-CM

## 2018-07-16 ENCOUNTER — Encounter: Payer: Self-pay | Admitting: Urology

## 2018-07-18 ENCOUNTER — Other Ambulatory Visit: Payer: BLUE CROSS/BLUE SHIELD

## 2018-07-24 ENCOUNTER — Ambulatory Visit: Payer: Self-pay | Admitting: Urology

## 2018-08-13 ENCOUNTER — Telehealth: Payer: Self-pay | Admitting: Certified Nurse Midwife

## 2018-08-13 NOTE — Telephone Encounter (Signed)
Patient sent the following correspondence through [place]. Routing to provider to assist patient with request. Closing encounter, FYI only  Hello - I have a form I will be bringing to my appointment. It's not something that needs to be filled out; it just needs a signature for HR that I have had my yearly physical. Just wanted to warn you. Thank you.

## 2018-08-16 ENCOUNTER — Encounter: Payer: Self-pay | Admitting: Certified Nurse Midwife

## 2018-08-16 ENCOUNTER — Ambulatory Visit (INDEPENDENT_AMBULATORY_CARE_PROVIDER_SITE_OTHER): Payer: BLUE CROSS/BLUE SHIELD | Admitting: Certified Nurse Midwife

## 2018-08-16 ENCOUNTER — Other Ambulatory Visit: Payer: Self-pay

## 2018-08-16 VITALS — BP 142/80 | HR 80 | Resp 14 | Ht 61.0 in | Wt 146.0 lb

## 2018-08-16 DIAGNOSIS — Z01419 Encounter for gynecological examination (general) (routine) without abnormal findings: Secondary | ICD-10-CM

## 2018-08-16 DIAGNOSIS — N951 Menopausal and female climacteric states: Secondary | ICD-10-CM

## 2018-08-16 NOTE — Progress Notes (Signed)
62 y.o. G59P2002 Married  Caucasian Fe here for annual exam.Denies vaginal bleeding. Using coconut oil  For dryness and astroglide for sexual activity. Had treatment for positional vertigo recently and feels so much better. Sees PCP for Hypertension management, labs and aex. All stable at this time. No other health issues today.   Patient's last menstrual period was 01/11/1992.          Sexually active: Yes.    The current method of family planning is status post hysterectomy.    Exercising: Yes.    cardio Smoker:  no  Review of Systems  Constitutional: Negative.   HENT: Negative.   Eyes: Negative.   Respiratory: Negative.   Cardiovascular: Negative.   Gastrointestinal: Negative.   Genitourinary: Negative.   Musculoskeletal: Negative.   Skin: Negative.   Neurological: Negative.   Endo/Heme/Allergies: Negative.   Psychiatric/Behavioral: Negative.     Health Maintenance: Pap:  2011 WNL  Prolapse prior to hysterectomy History of abnormal Pap: no MMG:  12-26-17 WNL Self Breast exams: no Colonoscopy:  03-02-09 repeat in 10 years BMD:   10-06-08  spine: -0.6; left hip neck -1.3; right hip neck -1.7 TDaP:  06-05-08  Shingles: 06-13-18 Pneumonia: no Hep C and HIV: 2017 NEG Labs: PCP   reports that she has never smoked. She has never used smokeless tobacco. She reports that she drinks about 7.0 standard drinks of alcohol per week. She reports that she does not use drugs.  Past Medical History:  Diagnosis Date  . Arthritis    Right knee osteoarthritis  . Hypertension 2012  . Meningioma ([place]) 04/2013   brain MRI(hx. "double vision" and treatment with radiation-last tx. radiation 8'14(no further problems)    Past Surgical History:  Procedure Laterality Date  . ABDOMINAL HYSTERECTOMY    . CESAREAN SECTION  1990  . KNEE ARTHROSCOPY Right 08/22/2013   Procedure: RIGHT KNEE ARTHROSCOPY WITH PARTIAL MEDIAL and LATERAL MENISECTOMY/DEBRIDEMENT;  Surgeon: Johnn Hai, MD;  Location: [facility];  Service: Orthopedics;  Laterality: Right;  . TUBAL LIGATION Bilateral 1992  . VAGINAL HYSTERECTOMY  1993   secondary prolapse w/A&P repair  . WISDOM TOOTH EXTRACTION  1978    Current Outpatient Medications  Medication Sig Dispense Refill  . Ascorbic Acid (VITAMIN C) 1000 MG tablet Take 1,000 mg by mouth daily.    Marland Kitchen aspirin EC 81 MG tablet Take 4 tablets (325 mg total) by mouth daily.    . calcium-vitamin D 250-100 MG-UNIT per tablet Take 1 tablet by mouth daily.     . Cyanocobalamin (VITAMIN B-12 PO) Take by mouth daily.    . hydrochlorothiazide (HYDRODIURIL) 25 MG tablet Take 25 mg by mouth every morning.     . Multiple Vitamin (MULTIVITAMIN WITH MINERALS) TABS tablet Take 1 tablet by mouth daily.    . Omega-3 Fatty Acids (FISH OIL PO) Take by mouth daily.     No current facility-administered medications for this visit.     Family History  Problem Relation Age of Onset  . Hypertension Mother   . Hypercholesterolemia Mother   . Heart disease Mother   . Heart disease Father   . Hypertension Father   . Hypertension Brother   . Hyperlipidemia Brother     ROS:  Pertinent items are noted in HPI.  Otherwise, a comprehensive ROS was negative.  Exam:   BP (!) 142/80 (BP Location: Right Arm, Patient Position: Sitting, Cuff Size: Normal)   Pulse 80   Resp 14   Ht 5'  1" (1.549 m)   Wt 146 lb (66.2 kg)   LMP 01/11/1992   BMI 27.59 kg/m  Height: 5\' 1"  (154.9 cm) Ht Readings from Last 3 Encounters:  08/16/18 5\' 1"  (1.549 m)  07/26/17 5' 0.5" (1.537 m)  07/03/17 5' 0.75" (1.543 m)    General appearance: alert, cooperative and appears stated age Head: Normocephalic, without obvious abnormality, atraumatic Neck: no adenopathy, supple, symmetrical, trachea midline and thyroid normal to inspection and palpation Lungs: clear to auscultation bilaterally Breasts: normal appearance, no masses or tenderness, No nipple retraction or dimpling, No nipple discharge or bleeding, No  axillary or supraclavicular adenopathy Heart: regular rate and rhythm Abdomen: soft, non-tender; no masses,  no organomegaly Extremities: extremities normal, atraumatic, no cyanosis or edema Skin: Skin color, texture, turgor normal. No rashes or lesions Lymph nodes: Cervical, supraclavicular, and axillary nodes normal. No abnormal inguinal nodes palpated Neurologic: Grossly normal   Pelvic: External genitalia:  no lesions              Urethra:  normal appearing urethra with no masses, tenderness or lesions              Bartholin's and Skene's: normal                 Vagina: normal appearing vagina with normal color and discharge, no lesions              Cervix: absent              Pap taken: No. Bimanual Exam:  Uterus:  uterus absent              Adnexa: normal adnexa and no mass, fullness, tenderness               Rectovaginal: Confirms               Anus:  normal sphincter tone, no lesions  Chaperone present: yes  A:  Well Woman with normal exam  Post menopausal S/P TVH prolapse and repair  Vaginal dryness, coconut oil working well  Hypertension with PCP management  Immunization due   P:   Reviewed health and wellness pertinent to exam  Discussed importance of moisture use with vaginal dryness to decrease risk of UTI and vaginal pain.  Continue follow up with PCP as indicated.  Declines Tdap today, will call and come in once she has her shingles vaccine over with.  Pap smear: no   counseled on breast self exam, mammography screening, feminine hygiene, adequate intake of calcium and vitamin D, diet and exercise, Kegel's exercises  return annually or prn  An After Visit Summary was printed and given to the patient.

## 2018-08-16 NOTE — Patient Instructions (Signed)

## 2018-08-16 NOTE — Progress Notes (Deleted)
62 y.o. G53P2002 Married  Caucasian Fe here for annual exam.    Patient's last menstrual period was 01/11/1992.          Sexually active: {yes no:314532}  The current method of family planning is status post hysterectomy.    Exercising: {yes no:314532}  {types:19826} Smoker:  {YES NO:22349}  ROS  Health Maintenance: Pap:  *** History of Abnormal Pap: {YES NO:22349} MMG:  *** Self Breast exams: {YES NO:22349} Colonoscopy:  *** BMD:   *** TDaP:  2009 Shingles: *** Pneumonia: *** Hep C and HIV: *** Labs: ***   reports that she has never smoked. She has never used smokeless tobacco. She reports that she drinks about 5.0 standard drinks of alcohol per week. She reports that she does not use drugs.  Past Medical History:  Diagnosis Date  . Arthritis    Right knee osteoarthritis  . Hypertension 2012  . Meningioma ([place]) 04/2013   brain MRI(hx. "double vision" and treatment with radiation-last tx. radiation 8'14(no further problems)    Past Surgical History:  Procedure Laterality Date  . ABDOMINAL HYSTERECTOMY    . CESAREAN SECTION  1990  . KNEE ARTHROSCOPY Right 08/22/2013   Procedure: RIGHT KNEE ARTHROSCOPY WITH PARTIAL MEDIAL and LATERAL MENISECTOMY/DEBRIDEMENT;  Surgeon: Johnn Hai, MD;  Location: [facility];  Service: Orthopedics;  Laterality: Right;  . TUBAL LIGATION Bilateral 1992  . VAGINAL HYSTERECTOMY  1993   secondary prolapse w/A&P repair  . WISDOM TOOTH EXTRACTION  1978    Current Outpatient Medications  Medication Sig Dispense Refill  . Ascorbic Acid (VITAMIN C) 1000 MG tablet Take 1,000 mg by mouth daily.    Marland Kitchen aspirin EC 81 MG tablet Take 4 tablets (325 mg total) by mouth daily.    . calcium-vitamin D 250-100 MG-UNIT per tablet Take 1 tablet by mouth daily.     . Cyanocobalamin (VITAMIN B-12 PO) Take by mouth daily.    . hydrochlorothiazide (HYDRODIURIL) 25 MG tablet Take 25 mg by mouth every morning.     . Multiple Vitamin (MULTIVITAMIN WITH MINERALS) TABS  tablet Take 1 tablet by mouth daily.    . Omega-3 Fatty Acids (FISH OIL PO) Take by mouth daily.     No current facility-administered medications for this visit.     Family History  Problem Relation Age of Onset  . Hypertension Mother   . Hypercholesterolemia Mother   . Heart disease Mother   . Heart disease Father   . Hypertension Father   . Hypertension Brother   . Hyperlipidemia Brother     ROS:  Pertinent items are noted in HPI.  Otherwise, a comprehensive ROS was negative.  Exam:   LMP 01/11/1992    Ht Readings from Last 3 Encounters:  07/26/17 5' 0.5" (1.537 m)  07/03/17 5' 0.75" (1.543 m)  07/12/16 5' 0.75" (1.543 m)    General appearance: alert, cooperative and appears stated age Head: Normocephalic, without obvious abnormality, atraumatic Neck: no adenopathy, supple, symmetrical, trachea midline and thyroid {EXAM; THYROID:18604} Lungs: clear to auscultation bilaterally Breasts: {Exam; breast:13139::"normal appearance, no masses or tenderness"} Heart: regular rate and rhythm Abdomen: soft, non-tender; no masses,  no organomegaly Extremities: extremities normal, atraumatic, no cyanosis or edema Skin: Skin color, texture, turgor normal. No rashes or lesions Lymph nodes: Cervical, supraclavicular, and axillary nodes normal. No abnormal inguinal nodes palpated Neurologic: Grossly normal   Pelvic: External genitalia:  no lesions              Urethra:  normal  appearing urethra with no masses, tenderness or lesions              Bartholin's and Skene's: normal                 Vagina: normal appearing vagina with normal color and discharge, no lesions              Cervix: {exam; cervix:14595}              Pap taken: {yes no:314532} Bimanual Exam:  Uterus:  {exam; uterus:12215}              Adnexa: {exam; adnexa:12223}               Rectovaginal: Confirms               Anus:  normal sphincter tone, no lesions  Chaperone present: ***  A:  Well Woman with normal  exam  P:   Reviewed health and wellness pertinent to exam  Pap smear: {YES NO:22349}  {plan; gyn:5269::"mammogram","pap smear","return annually or prn"}  An After Visit Summary was printed and given to the patient.

## 2018-08-20 DIAGNOSIS — Z23 Encounter for immunization: Secondary | ICD-10-CM | POA: Diagnosis not present

## 2018-08-30 DIAGNOSIS — R079 Chest pain, unspecified: Secondary | ICD-10-CM | POA: Diagnosis not present

## 2018-08-30 DIAGNOSIS — R0789 Other chest pain: Secondary | ICD-10-CM | POA: Diagnosis not present

## 2018-09-20 ENCOUNTER — Ambulatory Visit
Admission: RE | Admit: 2018-09-20 | Discharge: 2018-09-20 | Disposition: A | Discharge status: Home / Self Care | Payer: BLUE CROSS/BLUE SHIELD | Source: Ambulatory Visit | Attending: Radiation Oncology | Admitting: Radiation Oncology

## 2018-09-20 ENCOUNTER — Other Ambulatory Visit: Payer: Self-pay | Admitting: Radiation Therapy

## 2018-09-20 DIAGNOSIS — D329 Benign neoplasm of meninges, unspecified: Secondary | ICD-10-CM

## 2018-09-20 MED ORDER — GADOBENATE DIMEGLUMINE 529 MG/ML IV SOLN
13.0000 mL | Freq: Once | INTRAVENOUS | Status: AC | PRN
  Administered 2018-09-20: 13 mL via INTRAVENOUS

## 2018-09-24 ENCOUNTER — Other Ambulatory Visit: Payer: BLUE CROSS/BLUE SHIELD

## 2018-09-25 ENCOUNTER — Other Ambulatory Visit: Payer: Self-pay

## 2018-09-25 ENCOUNTER — Encounter: Payer: Self-pay | Admitting: Urology

## 2018-09-25 ENCOUNTER — Ambulatory Visit
Admission: RE | Admit: 2018-09-25 | Discharge: 2018-09-25 | Disposition: A | Discharge status: Home / Self Care | Payer: BLUE CROSS/BLUE SHIELD | Source: Ambulatory Visit | Attending: Urology | Admitting: Urology

## 2018-09-25 DIAGNOSIS — M1711 Unilateral primary osteoarthritis, right knee: Secondary | ICD-10-CM | POA: Diagnosis not present

## 2018-09-25 DIAGNOSIS — Z08 Encounter for follow-up examination after completed treatment for malignant neoplasm: Secondary | ICD-10-CM | POA: Diagnosis not present

## 2018-09-25 DIAGNOSIS — Z79899 Other long term (current) drug therapy: Secondary | ICD-10-CM | POA: Insufficient documentation

## 2018-09-25 DIAGNOSIS — I10 Essential (primary) hypertension: Secondary | ICD-10-CM | POA: Diagnosis not present

## 2018-09-25 DIAGNOSIS — Z86011 Personal history of benign neoplasm of the brain: Secondary | ICD-10-CM | POA: Diagnosis not present

## 2018-09-25 DIAGNOSIS — Z923 Personal history of irradiation: Secondary | ICD-10-CM | POA: Insufficient documentation

## 2018-09-25 DIAGNOSIS — Z7982 Long term (current) use of aspirin: Secondary | ICD-10-CM | POA: Insufficient documentation

## 2018-09-25 DIAGNOSIS — D329 Benign neoplasm of meninges, unspecified: Secondary | ICD-10-CM | POA: Diagnosis not present

## 2018-09-25 NOTE — Progress Notes (Signed)
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   Interval Since Last Radiation:  5 years  05/09/2013-06/19/2013: The meningioma was conformally treated to 52.2 Gy in 29 fractions of 1.8 Gy  Narrative:  The patient returns today for routine follow-up and has done well without progression or new meningioma since she was treated. Her last MRI on 09/20/18 revealed stability of her previously treated meningioma without evidence of new disease.                               On review of systems, the patient reports that she is doing well overall. She denies any chest pain, shortness of breath, cough, fevers, chills, night sweats, unintended weight changes. She denies any bowel or bladder disturbances, and denies abdominal pain, nausea or vomiting. She denies any new musculoskeletal or joint aches or pains, new skin lesions or concerns. She denies any headaches, changes in visual acuity or in light sensitivity. She denies any current dizziness or changes in speech. 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 Otherwise, she has had no notable changes in her health.  A complete review of systems is obtained and is otherwise negative.  Past Medical History:  Past Medical History:  Diagnosis Date  . Arthritis    Right knee osteoarthritis  . Hypertension 2012  . Meningioma ([place]) 04/2013   brain MRI(hx. "double vision" and treatment with radiation-last tx. radiation 8'14(no further problems)    Past Surgical History: Past Surgical History:  Procedure Laterality Date  . CESAREAN SECTION  1990  . KNEE ARTHROSCOPY Right 08/22/2013   Procedure:  RIGHT KNEE ARTHROSCOPY WITH PARTIAL MEDIAL and LATERAL MENISECTOMY/DEBRIDEMENT;  Surgeon: Johnn Hai, MD;  Location: [facility];  Service: Orthopedics;  Laterality: Right;  . OTHER SURGICAL HISTORY    . TUBAL LIGATION Bilateral 1992  . VAGINAL HYSTERECTOMY  1993   secondary prolapse w/A&P repair  . WISDOM TOOTH EXTRACTION  1978    Social History:  Social History   Socioeconomic History  . Marital status: Married    Spouse name: Not on file  . Number of children: 2  . Years of education: Not on file  . Highest education level: Not on file  Occupational History  . Occupation: [employer]    Employer: [employer]  Social Needs  . Financial resource strain: Not on file  . Food insecurity:    Worry: Not on file    Inability: Not on file  . Transportation needs:    Medical: Not on file    Non-medical: Not on file  Tobacco Use  . Smoking status: Never Smoker  . Smokeless tobacco: Never Used  Substance and Sexual Activity  . Alcohol use: Yes    Alcohol/week: 7.0 standard drinks    Types: 7 Standard drinks or equivalent per week  . Drug use: No  . Sexual activity: Yes    Partners: Male    Birth control/protection: Surgical    Comment: hysterectomy  Lifestyle  . Physical activity:    Days per week: Not on file  Minutes per session: Not on file  . Stress: Not on file  Relationships  . Social connections:    Talks on phone: Not on file    Gets together: Not on file    Attends religious service: Not on file    Active member of club or organization: Not on file    Attends meetings of clubs or organizations: Not on file    Relationship status: Not on file  . Intimate partner violence:    Fear of current or ex partner: Not on file    Emotionally abused: Not on file    Physically abused: Not on file    Forced sexual activity: Not on file  Other Topics Concern  . Not on file  Social History Narrative  . Not on file  The patient is married and lives in  [place]. She has worked at [employer] previously but currently works for [employer].  Family History: Family History  Problem Relation Age of Onset  . Hypertension Mother   . Hypercholesterolemia Mother   . Heart disease Mother   . Heart disease Father   . Hypertension Father   . Hypertension Brother   . Hyperlipidemia Brother     ALLERGIES:  has No Known Allergies.  Meds: Current Outpatient Medications  Medication Sig Dispense Refill  . Ascorbic Acid (VITAMIN C) 1000 MG tablet Take 1,000 mg by mouth daily.    Marland Kitchen aspirin EC 81 MG tablet Take 4 tablets (325 mg total) by mouth daily.    . calcium-vitamin D 250-100 MG-UNIT per tablet Take 1 tablet by mouth daily.     . Cyanocobalamin (VITAMIN B-12 PO) Take by mouth daily.    . hydrochlorothiazide (HYDRODIURIL) 25 MG tablet Take 25 mg by mouth every morning.     . Multiple Vitamin (MULTIVITAMIN WITH MINERALS) TABS tablet Take 1 tablet by mouth daily.    . Omega-3 Fatty Acids (FISH OIL PO) Take by mouth daily.     No current facility-administered medications for this encounter.     Physical Findings:  vitals were not taken for this visit.   /10 In general this is a well appearing Caucasian female in no acute distress. She's alert and oriented x4 and appropriate throughout the examination. Cardiopulmonary assessment is negative for acute distress and she exhibits normal effort. HEENT reveals that she is normocephalic, atraumatic. EOMs are intact bilaterally. PERRLA.   Lab Findings: Lab Results  Component Value Date   WBC 3.5 07/17/2017   HGB 14.6 07/17/2017   HCT 44.1 07/17/2017   MCV 101 (H) 07/17/2017   PLT 260 07/17/2017     Radiographic Findings: Mr [name] ZD Contrast  Result Date: 09/21/2018 CLINICAL DATA:  Follow-up treated meningioma. EXAM: MRI HEAD WITHOUT AND WITH CONTRAST TECHNIQUE: Multiplanar, multiecho pulse sequences of the brain and surrounding structures were obtained without and with intravenous  contrast. CONTRAST:  7mL MULTIHANCE GADOBENATE DIMEGLUMINE 529 MG/ML IV SOLN COMPARISON:  06/28/2017 FINDINGS: Brain: Right cavernous meningioma with filling of the cavernous sinus and extension along the anterior clinoid, lateral right sella, and upper right Meckel's cave. Unchanged lateral displacement of the encased right oculomotor nerve. Unchanged mild mass effect on the pituitary gland. The irregular shape limits reproducible measurement, but a rough estimate of size is 20 x 14 x 20 mm-stable. Unchanged covering of the right superior orbital fissure. No mass effect on the chiasm. Few scattered FLAIR hyperintensities in the cerebral white matter, possibly related patient's history of chronic hypertension. No incidental infarct,  hemorrhage, hydrocephalus, or collection. Brain volume is normal Vascular: Encased right cavernous ICA without evident narrowing Skull and upper cervical spine: Negative Sinuses/Orbits: As above IMPRESSION: Size stable right cavernous meningioma as described. Electronically Signed   By: Monte Fantasia M.D.   On: 09/21/2018 09:39    Impression/Plan: 1. 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 The patient continues to be clinically and radiographically stable and without complaints. We discussed continued surveillence and the potential for increasing the interval between scans since she is now 5 years out.  At this point, she is most comfortable with continuing to follow up with repeat brain MRI at the 1 year interval but will let us know if she decides otherwise.  In the meantime, she's counseled on continuing routine eye care with ophthalmology and will keep [place] informed of any questions or concerns.    Nicholos Johns, MMS, PA-C [place] [facility] [place]: [phone]  Fax: [phone]

## 2018-09-25 NOTE — Progress Notes (Signed)
Brain and Spine Tumor Board Documentation  Stephanie Beard was presented by Cecil Cobbs, MD at Brain and Spine Tumor Board on 09/25/2018, which included representatives from neuro oncology, radiation oncology, surgical oncology, navigation, pathology, radiology.  Stephanie Beard was presented as a current patient with history of the following treatments:  .  Additionally, we reviewed previous medical and familial history, history of present illness, and recent lab results along with all available histopathologic and imaging studies. The tumor board considered available treatment options and made the following recommendations:  Active surveillance    Tumor board is a meeting of clinicians from various specialty areas who evaluate and discuss patients for whom a multidisciplinary approach is being considered. Final determinations in the plan of care are those of the provider(s). The responsibility for follow up of recommendations given during tumor board is that of the provider.   Today's extended care, comprehensive team conference, Stephanie Beard was not present for the discussion and was not examined.

## 2018-09-28 ENCOUNTER — Ambulatory Visit (INDEPENDENT_AMBULATORY_CARE_PROVIDER_SITE_OTHER): Payer: BLUE CROSS/BLUE SHIELD

## 2018-09-28 VITALS — BP 136/78 | HR 68 | Resp 14 | Ht 61.0 in | Wt 144.4 lb

## 2018-09-28 DIAGNOSIS — Z23 Encounter for immunization: Secondary | ICD-10-CM | POA: Diagnosis not present

## 2018-09-28 NOTE — Progress Notes (Signed)
Patient here for T-dap injection. Last given 06/05/08.  Patient tolerated injection well in Right Deltoid.

## 2018-10-29 DIAGNOSIS — R9431 Abnormal electrocardiogram [ECG] [EKG]: Secondary | ICD-10-CM | POA: Insufficient documentation

## 2018-10-29 NOTE — Progress Notes (Signed)
Cardiology Office Note    Date:  10/30/2018   ID:  Rosalind, Guido [birth year], MRN [id number]  PCP:  Leighton Ruff, MD  Cardiologist:  Fransico Him, MD   No chief complaint on file.   History of Present Illness:  Stephanie Beard is a 63 y.o. female who is being seen today for the evaluation of family hx of CAD and chest pain at the request of Leighton Ruff, MD.  This is a 63yo female with a hx of HTN and hyperlipidemia who recently saw her PCP for routine PE and mention to her PCP that her brother was found a few years back to have a high calcium score and then later went on to cath and had stents placed.  She is now very concerned because she has a strong family history of coronary disease with her mother dying of an MI at 3 and her dad dying of an MI at 27.  She has had some mild chest discomfort which was a few weeks ago but she thinks it was indigestion.  She says it radiated across her chest into her axilla off and on for a month but was nonexertional.  She works out and never gets the pain then.  She felt like it was a burning sensation and eventually resolved on its own.  Never had it when she works out.  Her last LDL was 146 with a total cholesterol 241 HDL 75 with triglycerides 102.  She is now here for further evaluation.  She denies any SOB, DOE, PND, orthopnea, LE edema, dizziness, palpitations or syncope. She is compliant with her meds and is tolerating meds with no SE.    Past Medical History:  Diagnosis Date  . Arthritis    Right knee osteoarthritis  . Hypertension 2012  . Meningioma ([place]) 04/2013   brain MRI(hx. "double vision" and treatment with radiation-last tx. radiation 8'14(no further problems)    Past Surgical History:  Procedure Laterality Date  . CESAREAN SECTION  1990  . KNEE ARTHROSCOPY Right 08/22/2013   Procedure: RIGHT KNEE ARTHROSCOPY WITH PARTIAL MEDIAL and LATERAL MENISECTOMY/DEBRIDEMENT;  Surgeon: Johnn Hai, MD;  Location: [facility];   Service: Orthopedics;  Laterality: Right;  . OTHER SURGICAL HISTORY    . TUBAL LIGATION Bilateral 1992  . VAGINAL HYSTERECTOMY  1993   secondary prolapse w/A&P repair  . WISDOM TOOTH EXTRACTION  1978    Current Medications: Current Meds  Medication Sig  . Ascorbic Acid (VITAMIN C) 1000 MG tablet Take 1,000 mg by mouth daily.  Marland Kitchen aspirin EC 81 MG tablet Take 4 tablets (325 mg total) by mouth daily.  . calcium-vitamin D 250-100 MG-UNIT per tablet Take 1 tablet by mouth daily.   . Cyanocobalamin (VITAMIN B-12 PO) Take by mouth daily.  . hydrochlorothiazide (HYDRODIURIL) 25 MG tablet Take 25 mg by mouth every morning.   . Multiple Vitamin (MULTIVITAMIN WITH MINERALS) TABS tablet Take 1 tablet by mouth daily.  . Omega-3 Fatty Acids (FISH OIL PO) Take by mouth daily.    Allergies:   Patient has no known allergies.   Social History   Socioeconomic History  . Marital status: Married    Spouse name: Not on file  . Number of children: 2  . Years of education: Not on file  . Highest education level: Not on file  Occupational History  . Occupation: [employer]    Employer: [employer]  Social Needs  . Financial resource strain: Not  on file  . Food insecurity:    Worry: Not on file    Inability: Not on file  . Transportation needs:    Medical: Not on file    Non-medical: Not on file  Tobacco Use  . Smoking status: Never Smoker  . Smokeless tobacco: Never Used  Substance and Sexual Activity  . Alcohol use: Yes    Alcohol/week: 7.0 standard drinks    Types: 7 Standard drinks or equivalent per week  . Drug use: No  . Sexual activity: Yes    Partners: Male    Birth control/protection: Surgical    Comment: hysterectomy  Lifestyle  . Physical activity:    Days per week: Not on file    Minutes per session: Not on file  . Stress: Not on file  Relationships  . Social connections:    Talks on phone: Not on file    Gets together: Not on file    Attends religious  service: Not on file    Active member of club or organization: Not on file    Attends meetings of clubs or organizations: Not on file    Relationship status: Not on file  Other Topics Concern  . Not on file  Social History Narrative  . Not on file     Family History:  The patient's family history includes Heart disease in her father and mother; Hypercholesterolemia in her mother; Hyperlipidemia in her brother; Hypertension in her brother, father, and mother.   ROS:   Please see the history of present illness.    ROS All other systems reviewed and are negative.  No flowsheet data found.     PHYSICAL EXAM:   VS:  BP (!) 148/92   Pulse 71   Ht 5\' 1"  (1.549 m)   Wt 142 lb 12.8 oz (64.8 kg)   LMP 01/11/1992   BMI 26.98 kg/m    GEN: Well nourished, well developed, in no acute distress  HEENT: normal  Neck: no JVD, carotid bruits, or masses Cardiac: RRR; no murmurs, rubs, or gallops,no edema.  Intact distal pulses bilaterally.  Respiratory:  clear to auscultation bilaterally, normal work of breathing GI: soft, nontender, nondistended, + BS MS: no deformity or atrophy  Skin: warm and dry, no rash Neuro:  Alert and Oriented x 3, Strength and sensation are intact Psych: euthymic mood, full affect  Wt Readings from Last 3 Encounters:  10/30/18 142 lb 12.8 oz (64.8 kg)  09/28/18 144 lb 6.4 oz (65.5 kg)  08/16/18 146 lb (66.2 kg)      Studies/Labs Reviewed:   EKG:  EKG is ordered today.  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  Recent Labs: No results found for requested labs within last 8760 hours.   Lipid Panel    Component Value Date/Time   CHOL 267 (H) 07/17/2017 0912   TRIG 95 07/17/2017 0912   HDL 85 07/17/2017 0912   CHOLHDL 3.1 07/17/2017 0912   LDLCALC 163 (H) 07/17/2017 0912    Additional studies/ records that were reviewed today  include:  Office notes form PCP    ASSESSMENT:    1. Chest pain, unspecified type   2. Essential hypertension   3. Pure hypercholesterolemia   4. Abnormal EKG      PLAN:  In order of problems listed above:  1.  Chest pain -is very atypical and likely related  to indigestion.  It is a burning sensation that comes and goes and is always nonexertional.  It is actually since she saw her PCP resolved.  She says that when she would get it would burning go across her chest into her left axilla.  She never got it when she worked out and she does workout quite a bit.  There were no associated symptoms of nausea, diaphoresis or radiation of the discomfort.  EKG is nonischemic.  She does have cardiac risk factors including hypertension, hyperlipidemia that is untreated and a family history of premature CAD.  I have recommended proceeding with an exercise treadmill test to rule out ischemia.  I will also get a coronary calcium score to assess future risk  2.  Hypertension - BP is mildly elevated on exam today.  She will continue taking HCTZ 25 mg daily and we will assess her blood pressure response to exercise and if it is elevated she will need a second antihypertensive agent.  3.  Hyperlipidemia -she has been reluctant to go on a statin.  Recent lipid showed a total cholesterol 241 HDL 75 with triglycerides 102.  Her ten-year risk of CVD is 6.2%.  Therefore if she is found to have coronary calcium and recommend she go on a statin to get LDL less than 70.    Medication Adjustments/Labs and Tests Ordered: Current medicines are reviewed at length with the patient today.  Concerns regarding medicines are outlined above.  Medication changes, Labs and Tests ordered today are listed in the Patient Instructions below.  Patient Instructions  Medication Instructions:  Your provider recommends that you continue on your current medications as directed. Please refer to the Current Medication list given to you  today.    Labwork: None  Testing/Procedures: Dr. Radford Pax recommends you have a CORONARY CALCIUM SCORE. This test is completed at our office in the [place]. It takes about 10 minutes. There is a flat $150.00 fee due at the time of service.   Your provider has requested that you have an exercise tolerance test. For further information please visit [email or web address]. Please also follow instruction sheet, as given.   Follow-Up: Your provider recommends that you schedule a follow-up appointment AS NEEDED pending study results.       Signed, Fransico Him, MD  10/30/2018 2:15 PM    [facility] [place], [place],   [address] Phone: [phone]; Fax: [phone]

## 2018-10-30 ENCOUNTER — Ambulatory Visit: Payer: BLUE CROSS/BLUE SHIELD | Admitting: Cardiology

## 2018-10-30 ENCOUNTER — Encounter: Payer: Self-pay | Admitting: Cardiology

## 2018-10-30 VITALS — BP 148/92 | HR 71 | Ht 61.0 in | Wt 142.8 lb

## 2018-10-30 DIAGNOSIS — E7800 Pure hypercholesterolemia, unspecified: Secondary | ICD-10-CM

## 2018-10-30 DIAGNOSIS — I10 Essential (primary) hypertension: Secondary | ICD-10-CM | POA: Diagnosis not present

## 2018-10-30 DIAGNOSIS — R079 Chest pain, unspecified: Secondary | ICD-10-CM | POA: Diagnosis not present

## 2018-10-30 DIAGNOSIS — R9431 Abnormal electrocardiogram [ECG] [EKG]: Secondary | ICD-10-CM

## 2018-10-30 NOTE — Patient Instructions (Signed)
Medication Instructions:  Your provider recommends that you continue on your current medications as directed. Please refer to the Current Medication list given to you today.    Labwork: None  Testing/Procedures: Dr. Radford Pax recommends you have a CORONARY CALCIUM SCORE. This test is completed at our office in the [place]. It takes about 10 minutes. There is a flat $150.00 fee due at the time of service.   Your provider has requested that you have an exercise tolerance test. For further information please visit [email or web address]. Please also follow instruction sheet, as given.   Follow-Up: Your provider recommends that you schedule a follow-up appointment AS NEEDED pending study results.

## 2018-11-20 ENCOUNTER — Ambulatory Visit (INDEPENDENT_AMBULATORY_CARE_PROVIDER_SITE_OTHER)
Admission: RE | Admit: 2018-11-20 | Discharge: 2018-11-20 | Disposition: A | Discharge status: Home / Self Care | Payer: Self-pay | Source: Ambulatory Visit | Attending: Cardiology | Admitting: Cardiology

## 2018-11-20 ENCOUNTER — Ambulatory Visit (INDEPENDENT_AMBULATORY_CARE_PROVIDER_SITE_OTHER): Payer: BLUE CROSS/BLUE SHIELD

## 2018-11-20 ENCOUNTER — Telehealth: Payer: Self-pay

## 2018-11-20 DIAGNOSIS — R079 Chest pain, unspecified: Secondary | ICD-10-CM

## 2018-11-20 DIAGNOSIS — I10 Essential (primary) hypertension: Secondary | ICD-10-CM

## 2018-11-20 DIAGNOSIS — K769 Liver disease, unspecified: Secondary | ICD-10-CM

## 2018-11-20 LAB — EXERCISE TOLERANCE TEST
CHL CUP MPHR: 159 {beats}/min
CHL CUP RESTING HR STRESS: 71 {beats}/min
CSEPEDS: 0 s
Estimated workload: 13.4 METS
Exercise duration (min): 10 min
Peak BP: 97 mmHg
Peak HR: 155 {beats}/min
Percent HR: 97 %

## 2018-11-20 NOTE — Telephone Encounter (Signed)
-----   Message from Sueanne Margarita, MD sent at 11/20/2018  2:23 PM EDT ----- Please let patient know that noncardiac portion of chest CT showed at least one liver lesion.  The findings are nonspecific on the scan it was noncontrasted.  Please get a dedicated liver MRI with and without contrast

## 2018-11-20 NOTE — Telephone Encounter (Signed)
Spoke with the patient, she expressed understanding about her results. She accepted having a Liver MRI. She had no further questions.

## 2018-11-26 ENCOUNTER — Other Ambulatory Visit: Payer: Self-pay

## 2018-11-26 ENCOUNTER — Other Ambulatory Visit: Payer: BLUE CROSS/BLUE SHIELD | Admitting: *Deleted

## 2018-11-26 DIAGNOSIS — I10 Essential (primary) hypertension: Secondary | ICD-10-CM | POA: Diagnosis not present

## 2018-11-26 DIAGNOSIS — K769 Liver disease, unspecified: Secondary | ICD-10-CM | POA: Diagnosis not present

## 2018-11-26 LAB — BASIC METABOLIC PANEL
BUN / CREAT RATIO: 16 (ref 12–28)
BUN: 11 mg/dL (ref 8–27)
CO2: 24 mmol/L (ref 20–29)
CREATININE: 0.67 mg/dL (ref 0.57–1.00)
Calcium: 10 mg/dL (ref 8.7–10.3)
Chloride: 100 mmol/L (ref 96–106)
GFR calc Af Amer: 110 mL/min/{1.73_m2} (ref >59)
GFR calc non Af Amer: 95 mL/min/{1.73_m2} (ref >59)
Glucose: 86 mg/dL (ref 65–99)
Potassium: 3.5 mmol/L (ref 3.5–5.2)
Sodium: 140 mmol/L (ref 134–144)

## 2018-11-27 ENCOUNTER — Telehealth: Payer: Self-pay

## 2018-11-27 DIAGNOSIS — E876 Hypokalemia: Secondary | ICD-10-CM

## 2018-11-27 DIAGNOSIS — Z79899 Other long term (current) drug therapy: Secondary | ICD-10-CM

## 2018-11-27 DIAGNOSIS — I10 Essential (primary) hypertension: Secondary | ICD-10-CM

## 2018-11-27 MED ORDER — POTASSIUM CHLORIDE CRYS ER 20 MEQ PO TBCR: 20.0000 meq | EXTENDED_RELEASE_TABLET | Freq: Every day | ORAL | 3 refills | Status: AC

## 2018-11-27 NOTE — Telephone Encounter (Signed)
Pt verbalized understanding of her lab results.

## 2018-11-27 NOTE — Telephone Encounter (Signed)
LMTCB

## 2018-11-27 NOTE — Telephone Encounter (Signed)
-----   Message from Frederik Schmidt, RN sent at 11/27/2018  7:49 AM EDT -----  ----- Message ----- From: Sueanne Margarita, MD Sent: 11/26/2018   6:52 PM EDT To: Leighton Ruff, MD, Sarina Ill, RN, #  Potassium borderline low - add Kdur 31meq daily and repeat BMET in 2 weeks

## 2018-11-29 ENCOUNTER — Encounter (HOSPITAL_COMMUNITY): Payer: Self-pay

## 2018-11-29 ENCOUNTER — Ambulatory Visit (HOSPITAL_COMMUNITY): Payer: BLUE CROSS/BLUE SHIELD

## 2018-11-29 DIAGNOSIS — E7800 Pure hypercholesterolemia, unspecified: Secondary | ICD-10-CM

## 2018-11-30 MED ORDER — ATORVASTATIN CALCIUM 20 MG PO TABS: 20.0000 mg | ORAL_TABLET | Freq: Every day | ORAL | 11 refills | Status: DC

## 2018-11-30 NOTE — Telephone Encounter (Signed)
Notes recorded by Sueanne Margarita, MD on 11/29/2018 at 5:40 PM EDT Please let patient know that calcium score is elevated and her LDL is too high. Please start her on Lipitor 20mg  daily and repeat FLP and ALT in 6 weeks

## 2018-11-30 NOTE — Telephone Encounter (Signed)
Spoke with the patient, she expressed understanding. She will start Lipitor 20 mg, daily and have fasting labs on 01/14/19. She had no further questions.

## 2018-12-11 ENCOUNTER — Other Ambulatory Visit: Payer: Self-pay

## 2018-12-11 ENCOUNTER — Other Ambulatory Visit: Payer: BLUE CROSS/BLUE SHIELD

## 2018-12-11 DIAGNOSIS — E876 Hypokalemia: Secondary | ICD-10-CM | POA: Diagnosis not present

## 2018-12-11 DIAGNOSIS — Z79899 Other long term (current) drug therapy: Secondary | ICD-10-CM | POA: Diagnosis not present

## 2018-12-11 DIAGNOSIS — I10 Essential (primary) hypertension: Secondary | ICD-10-CM

## 2018-12-11 LAB — BASIC METABOLIC PANEL
BUN/Creatinine Ratio: 13 (ref 12–28)
BUN: 7 mg/dL — ABNORMAL LOW (ref 8–27)
CHLORIDE: 98 mmol/L (ref 96–106)
CO2: 23 mmol/L (ref 20–29)
Calcium: 9.9 mg/dL (ref 8.7–10.3)
Creatinine, Ser: 0.54 mg/dL — ABNORMAL LOW (ref 0.57–1.00)
GFR calc non Af Amer: 102 mL/min/{1.73_m2} (ref >59)
GFR, EST AFRICAN AMERICAN: 118 mL/min/{1.73_m2} (ref >59)
Glucose: 69 mg/dL (ref 65–99)
POTASSIUM: 3.9 mmol/L (ref 3.5–5.2)
Sodium: 139 mmol/L (ref 134–144)

## 2018-12-19 NOTE — Telephone Encounter (Signed)
Changed order to [facility] imaging.

## 2018-12-19 NOTE — Telephone Encounter (Signed)
From: Monico Blitz Sent: 12/11/2018 12:31 PM EDT To: [facility] Triage  This is actually a scheduling question. I'm scheduled for an MRI at [facility] on 4/20. Would it be possible to move that to [facility]? I've had studies there before, and I like them. Thank you.

## 2018-12-19 NOTE — Addendum Note (Signed)
Addended by: Sarina Ill on: 12/19/2018 08:35 AM   Modules accepted: Orders

## 2018-12-19 NOTE — Telephone Encounter (Signed)
Spoke with the patient, she expressed understanding about her MR liver changing to [facility]. She had no further questions.

## 2018-12-26 ENCOUNTER — Ambulatory Visit
Admission: RE | Admit: 2018-12-26 | Discharge: 2018-12-26 | Disposition: A | Discharge status: Home / Self Care | Payer: BLUE CROSS/BLUE SHIELD | Source: Ambulatory Visit | Attending: Cardiology | Admitting: Cardiology

## 2018-12-26 ENCOUNTER — Other Ambulatory Visit: Payer: Self-pay

## 2018-12-26 DIAGNOSIS — K769 Liver disease, unspecified: Secondary | ICD-10-CM | POA: Diagnosis not present

## 2018-12-26 MED ORDER — GADOBENATE DIMEGLUMINE 529 MG/ML IV SOLN
13.0000 mL | Freq: Once | INTRAVENOUS | Status: AC | PRN
  Administered 2018-12-26: 13 mL via INTRAVENOUS

## 2018-12-31 ENCOUNTER — Ambulatory Visit (HOSPITAL_COMMUNITY): Payer: BLUE CROSS/BLUE SHIELD

## 2019-01-14 ENCOUNTER — Other Ambulatory Visit: Payer: BLUE CROSS/BLUE SHIELD

## 2019-01-23 ENCOUNTER — Other Ambulatory Visit: Payer: Self-pay | Admitting: Cardiology

## 2019-01-23 MED ORDER — ATORVASTATIN CALCIUM 20 MG PO TABS: 20.0000 mg | ORAL_TABLET | Freq: Every day | ORAL | 3 refills | Status: DC

## 2019-01-29 ENCOUNTER — Other Ambulatory Visit: Payer: Self-pay | Admitting: Certified Nurse Midwife

## 2019-01-29 DIAGNOSIS — E7800 Pure hypercholesterolemia, unspecified: Secondary | ICD-10-CM | POA: Diagnosis not present

## 2019-01-29 DIAGNOSIS — I10 Essential (primary) hypertension: Secondary | ICD-10-CM | POA: Diagnosis not present

## 2019-01-29 DIAGNOSIS — Z1231 Encounter for screening mammogram for malignant neoplasm of breast: Secondary | ICD-10-CM

## 2019-01-30 ENCOUNTER — Other Ambulatory Visit: Payer: Self-pay

## 2019-01-30 ENCOUNTER — Ambulatory Visit
Admission: RE | Admit: 2019-01-30 | Discharge: 2019-01-30 | Disposition: A | Discharge status: Home / Self Care | Payer: BLUE CROSS/BLUE SHIELD | Source: Ambulatory Visit | Attending: Certified Nurse Midwife | Admitting: Certified Nurse Midwife

## 2019-01-30 ENCOUNTER — Other Ambulatory Visit: Payer: BLUE CROSS/BLUE SHIELD | Admitting: *Deleted

## 2019-01-30 DIAGNOSIS — E7800 Pure hypercholesterolemia, unspecified: Secondary | ICD-10-CM

## 2019-01-30 DIAGNOSIS — Z1231 Encounter for screening mammogram for malignant neoplasm of breast: Secondary | ICD-10-CM

## 2019-01-30 LAB — LIPID PANEL
Chol/HDL Ratio: 2.6 ratio (ref 0.0–4.4)
Cholesterol, Total: 233 mg/dL — ABNORMAL HIGH (ref 100–199)
HDL: 89 mg/dL (ref >39)
LDL Calculated: 128 mg/dL — ABNORMAL HIGH (ref 0–99)
Triglycerides: 82 mg/dL (ref 0–149)
VLDL Cholesterol Cal: 16 mg/dL (ref 5–40)

## 2019-01-30 LAB — ALT: ALT: 20 IU/L (ref 0–32)

## 2019-02-07 ENCOUNTER — Telehealth: Payer: Self-pay

## 2019-02-07 DIAGNOSIS — E7800 Pure hypercholesterolemia, unspecified: Secondary | ICD-10-CM

## 2019-02-07 MED ORDER — ATORVASTATIN CALCIUM 40 MG PO TABS: 40.0000 mg | ORAL_TABLET | Freq: Every day | ORAL | 3 refills | Status: DC

## 2019-02-07 NOTE — Telephone Encounter (Signed)
Spoke with the patient, she expressed understanding about the changes and will have repeat labs on 04/02/19.

## 2019-02-07 NOTE — Telephone Encounter (Signed)
-----   Message from Sueanne Margarita, MD sent at 01/30/2019  9:17 PM EDT ----- LDL goal is < 70.  Please increase atorvastatin to 40mg  daily and repeat FLP and ALT in 8 weeks

## 2019-02-12 ENCOUNTER — Other Ambulatory Visit: Payer: BLUE CROSS/BLUE SHIELD

## 2019-03-01 ENCOUNTER — Encounter: Payer: Self-pay | Admitting: Gastroenterology

## 2019-04-02 ENCOUNTER — Other Ambulatory Visit: Payer: Self-pay

## 2019-04-02 ENCOUNTER — Other Ambulatory Visit: Payer: BC Managed Care – PPO

## 2019-04-02 DIAGNOSIS — E7800 Pure hypercholesterolemia, unspecified: Secondary | ICD-10-CM

## 2019-04-02 DIAGNOSIS — Z20828 Contact with and (suspected) exposure to other viral communicable diseases: Secondary | ICD-10-CM | POA: Diagnosis not present

## 2019-04-02 LAB — HEPATIC FUNCTION PANEL
ALT: 17 IU/L (ref 0–32)
AST: 18 IU/L (ref 0–40)
Albumin: 4.6 g/dL (ref 3.8–4.8)
Alkaline Phosphatase: 49 IU/L (ref 39–117)
Bilirubin Total: 0.6 mg/dL (ref 0.0–1.2)
Bilirubin, Direct: 0.17 mg/dL (ref 0.00–0.40)
Total Protein: 6.6 g/dL (ref 6.0–8.5)

## 2019-04-02 LAB — LIPID PANEL
Chol/HDL Ratio: 2.4 ratio (ref 0.0–4.4)
Cholesterol, Total: 189 mg/dL (ref 100–199)
HDL: 79 mg/dL (ref >39)
LDL Calculated: 94 mg/dL (ref 0–99)
Triglycerides: 81 mg/dL (ref 0–149)
VLDL Cholesterol Cal: 16 mg/dL (ref 5–40)

## 2019-04-03 ENCOUNTER — Telehealth: Payer: Self-pay | Admitting: *Deleted

## 2019-04-03 MED ORDER — ATORVASTATIN CALCIUM 80 MG PO TABS: 80.0000 mg | ORAL_TABLET | Freq: Every day | ORAL | 3 refills | Status: AC

## 2019-04-03 NOTE — Telephone Encounter (Signed)
-----   Message from Sueanne Margarita, MD sent at 04/02/2019  8:13 PM EDT ----- LDL goal is < 70.  Please have her increase Lipitor to 80mg  daily and repeat FLP and ALT in 6 weeks

## 2019-04-03 NOTE — Telephone Encounter (Signed)
Pt has been notified of lab results and recommendations. Pt states to me that she lost her job and insurance and has new insurance now and [facility] is not in network. Pt said he insurance will change as of 04/13/19. After speaking with the pt I did suggest she still pick up new Rx for Lipitor while she has the insurance. Pt is agreeable. As far as labs, pt said she will f/u labs with PCP Dr. Leighton Ruff with [facility]. Pt thanked me for the call. I will send in new Rx for Lipitor 80 mg daily. I will also forward labs to PCP as FYI. Patient notified of result.  Please refer to phone note from today for complete details.   Julaine Hua, CMA 04/03/2019 10:14 AM

## 2019-05-14 DIAGNOSIS — H6123 Impacted cerumen, bilateral: Secondary | ICD-10-CM | POA: Diagnosis not present

## 2019-07-08 DIAGNOSIS — E782 Mixed hyperlipidemia: Secondary | ICD-10-CM | POA: Diagnosis not present

## 2019-07-08 DIAGNOSIS — I10 Essential (primary) hypertension: Secondary | ICD-10-CM | POA: Diagnosis not present

## 2019-08-05 DIAGNOSIS — H01131 Eczematous dermatitis of right upper eyelid: Secondary | ICD-10-CM | POA: Diagnosis not present

## 2019-08-05 DIAGNOSIS — L570 Actinic keratosis: Secondary | ICD-10-CM | POA: Diagnosis not present

## 2019-08-05 DIAGNOSIS — D229 Melanocytic nevi, unspecified: Secondary | ICD-10-CM | POA: Diagnosis not present

## 2019-08-05 DIAGNOSIS — C44319 Basal cell carcinoma of skin of other parts of face: Secondary | ICD-10-CM | POA: Diagnosis not present

## 2019-08-05 DIAGNOSIS — L814 Other melanin hyperpigmentation: Secondary | ICD-10-CM | POA: Diagnosis not present

## 2019-08-05 DIAGNOSIS — D485 Neoplasm of uncertain behavior of skin: Secondary | ICD-10-CM | POA: Diagnosis not present

## 2019-08-15 DIAGNOSIS — C44319 Basal cell carcinoma of skin of other parts of face: Secondary | ICD-10-CM | POA: Diagnosis not present

## 2019-08-20 ENCOUNTER — Ambulatory Visit: Payer: BLUE CROSS/BLUE SHIELD | Admitting: Certified Nurse Midwife

## 2019-08-28 ENCOUNTER — Other Ambulatory Visit: Payer: Self-pay | Admitting: Radiation Therapy

## 2019-08-28 DIAGNOSIS — D329 Benign neoplasm of meninges, unspecified: Secondary | ICD-10-CM

## 2019-08-29 ENCOUNTER — Other Ambulatory Visit: Payer: Self-pay | Admitting: Radiation Therapy

## 2019-09-02 ENCOUNTER — Telehealth: Payer: Self-pay | Admitting: Radiation Therapy

## 2019-09-02 NOTE — Telephone Encounter (Signed)
Called Barb when I noticed that the 1 year follow-up MRI we had set up had been cancelled. She has changed jobs and we are no longer in her network for insurance coverage. She said that she did not want to make a big deal about it because during her last visit she was told that she did not need to come back if she didn't want to. I will pass this along to Ashlyn and cancel the follow-up and tumor board visits that were scheduled.   Barb ensured me that she is doing well, no complaints or problems since the radiation treatment to her meningioma.   Mont Dutton R.T.(R)(T) Radiation Special Procedures Navigator

## 2019-09-26 ENCOUNTER — Other Ambulatory Visit: Payer: BC Managed Care – PPO

## 2019-10-02 ENCOUNTER — Ambulatory Visit: Payer: Self-pay | Admitting: Urology

## 2019-10-17 ENCOUNTER — Encounter: Payer: Self-pay | Admitting: Physical Therapy

## 2019-10-17 NOTE — Therapy (Signed)
[facility] [address] [place], [place], [address] Phone: [phone]   Fax:  [phone]  Patient Details  Name: [name] MRN: [id number] Date of Birth: [birth year] Referring Provider:  Dr. Izora Gala  Encounter Date: 10/17/2019  PHYSICAL THERAPY DISCHARGE SUMMARY  Pt was seen for eval only for BPPV on 04-30-18.  Pt cancelled 2nd scheduled appt due to feeling better, stating vertigo had resolved.  Pt was seen for eval only.  Alda Lea, PT 10/17/2019, 9:39 AM  [facility] [address] [place], [place], [address] Phone: [phone]   Fax:  [phone]

## 2019-12-03 ENCOUNTER — Encounter: Payer: Self-pay | Admitting: Certified Nurse Midwife

## 2020-03-11 IMAGING — MR MR HEAD WO/W CM
11 series · 48 of 48 positions shown · IV contrast (Multihance 13ml)
Comparison: 06/28/2017

CLINICAL DATA: Follow-up treated meningioma.

EXAM:
MRI HEAD WITHOUT AND WITH CONTRAST
TECHNIQUE: Multiplanar, multiecho pulse sequences of the brain and surrounding
structures were obtained without and with intravenous contrast.
CONTRAST:  13mL MULTIHANCE GADOBENATE DIMEGLUMINE 529 MG/ML IV SOLN

[Series 15: FLAIR · sagittal · 3.0mm · 0.75mm/px · 2 of 38 slices shown (1 of 2)]
[im 1/38]
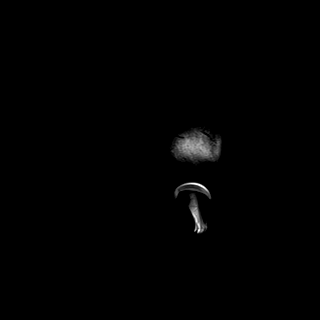
[im 38/38]
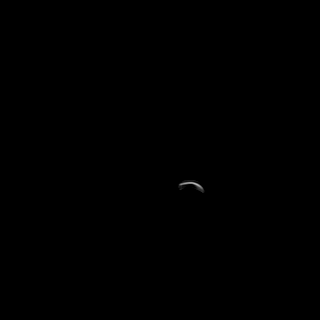

[Series 16: DWI · axial · 3.0mm · 1.50mm/px · z∈[-78,+70]mm · 5 of 78 slices shown (1 of 2)]
[im 1/78]
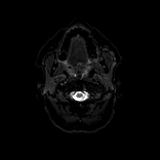
[im 20/78]
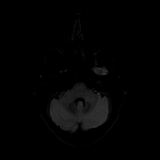
[im 39/78]
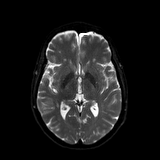
[im 58/78]
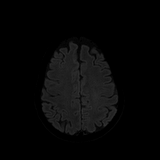
[im 78/78]
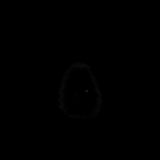

[Series 17: DWI · axial · 3.0mm · 1.50mm/px · z∈[-78,+70]mm · 3 of 39 slices shown (2 of 2)]
[im 1/39]
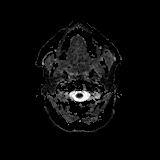
[im 20/39]
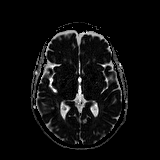
[im 39/39]
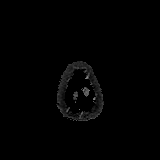

[Series 18: T2 · axial · 5.0mm · 0.57mm/px · z∈[-82,+74]mm · 2 of 27 slices shown]
[im 1/27]
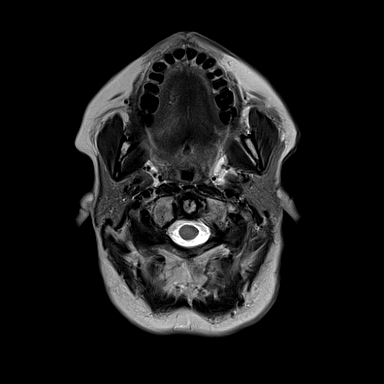
[im 27/27]
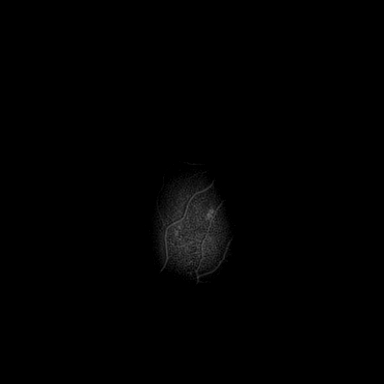

[Series 19: GRE · axial · 5.0mm · 0.57mm/px · z∈[-82,+74]mm · 2 of 27 slices shown]
[im 1/27]
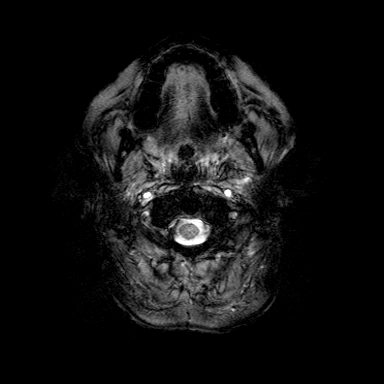
[im 27/27]
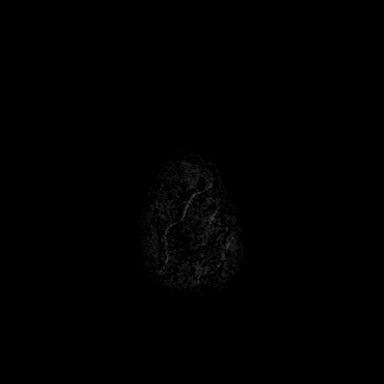

[Series 20: FLAIR · axial · 3.0mm · 0.57mm/px · z∈[-78,+75]mm · 3 of 52 slices shown (2 of 2)]
[im 1/52]
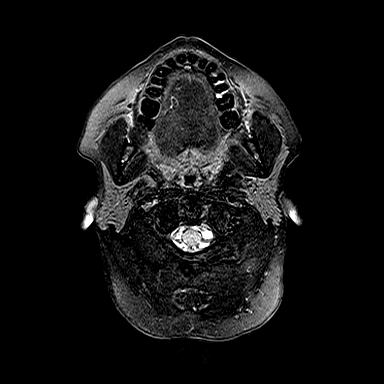
[im 26/52]
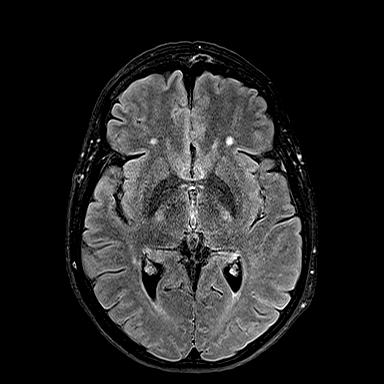
[im 52/52]
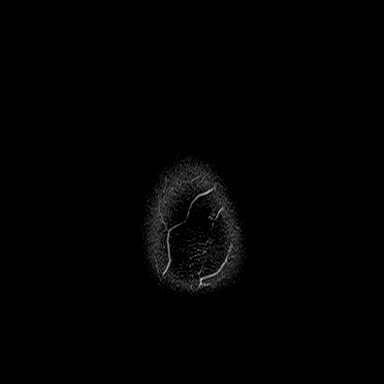

[Series 21: T1 · axial · 1.0mm · 0.75mm/px · z∈[-81,+78]mm · 11 of 160 slices shown]
[im 1/160]
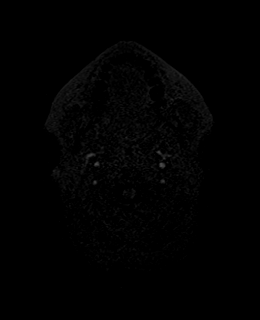
[im 16/160]
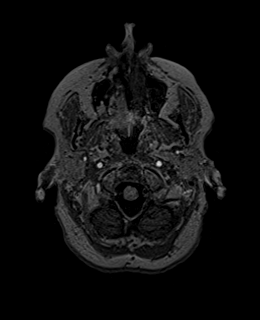
[im 32/160]
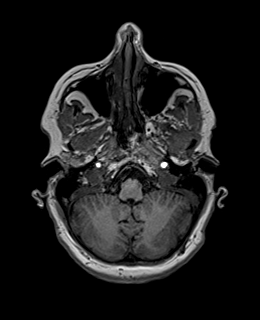
[im 48/160]
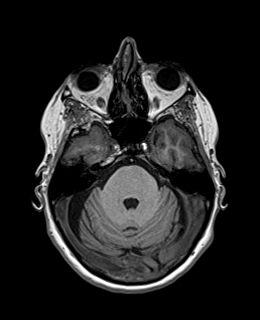
[im 64/160]
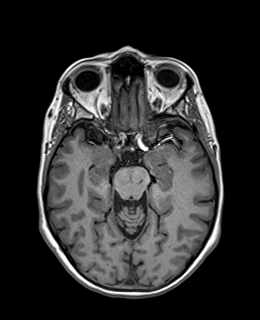
[im 80/160]
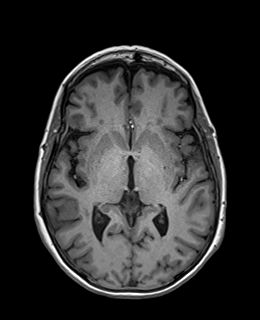
[im 96/160]
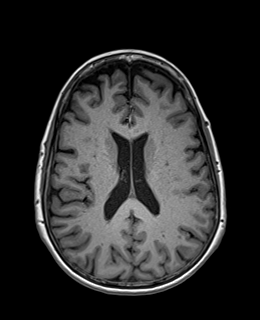
[im 112/160]
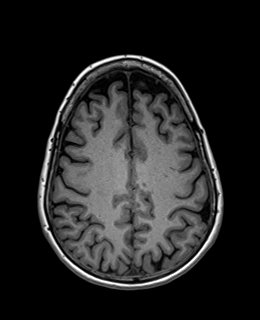
[im 128/160]
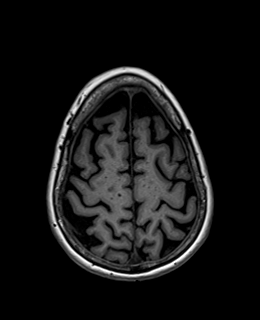
[im 144/160]
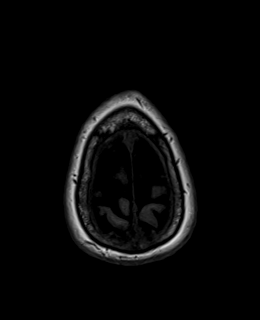
[im 160/160]
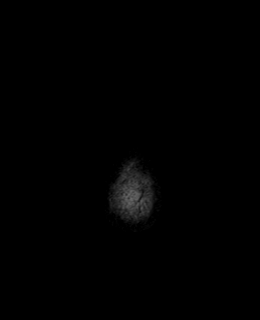

[Series 22: T2 post-contrast · coronal · 3.0mm · 0.57mm/px · 3 of 47 slices shown]
[im 1/47]
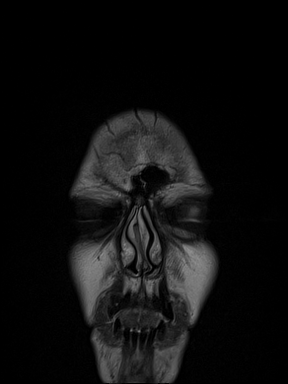
[im 24/47]
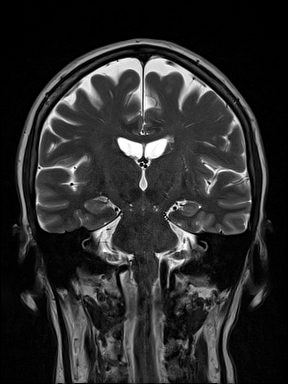
[im 47/47]
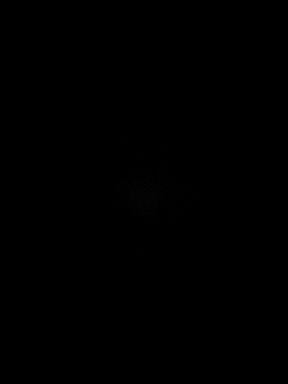

[Series 23: T1 post-contrast · axial · 1.0mm · 0.75mm/px · z∈[-81,+78]mm · 11 of 160 slices shown (1 of 2)]
[im 1/160]
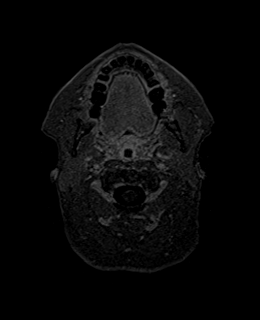
[im 16/160]
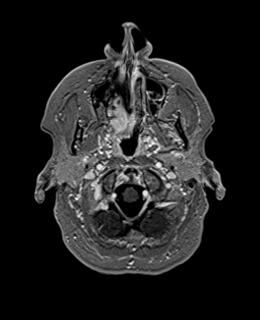
[im 32/160]
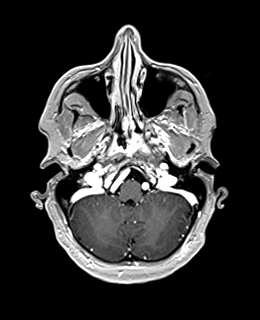
[im 48/160]
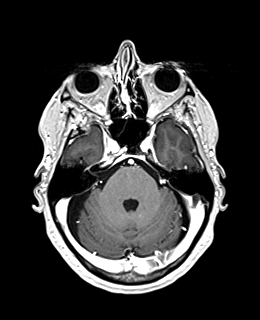
[im 64/160]
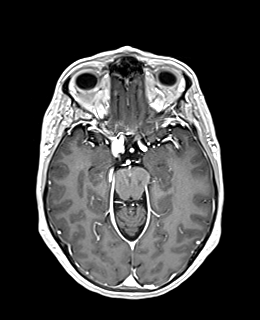
[im 80/160]
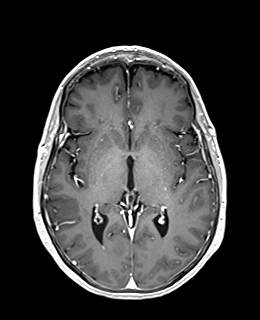
[im 96/160]
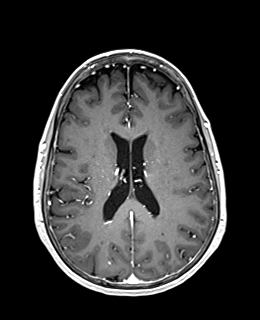
[im 112/160]
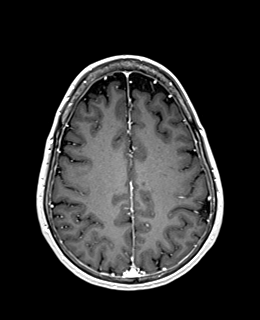
[im 128/160]
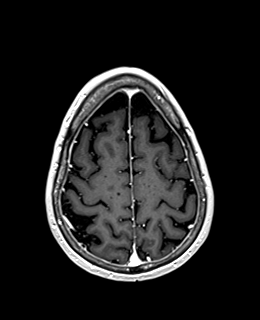
[im 144/160]
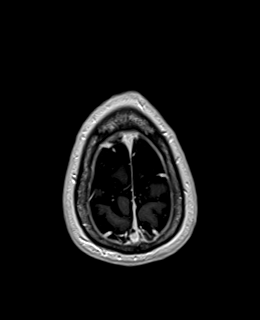
[im 160/160]
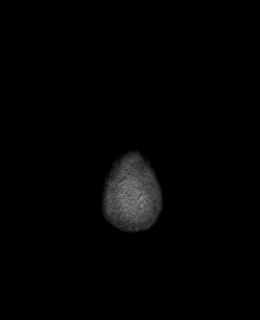

[Series 24: T1 post-contrast · coronal · 3.0mm · 0.57mm/px · 3 of 47 slices shown (2 of 2)]
[im 1/47]
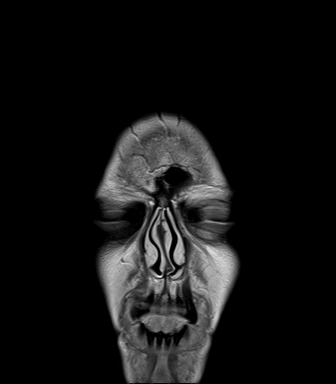
[im 24/47]
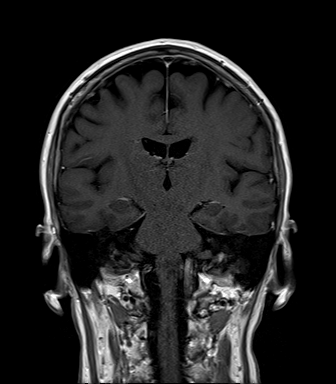
[im 47/47]
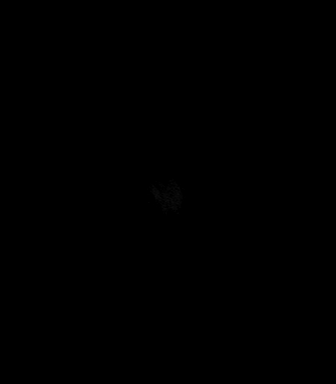

[Series 25: FLAIR post-contrast · sagittal · 3.0mm · 0.75mm/px · 3 of 39 slices shown]
[im 1/39]
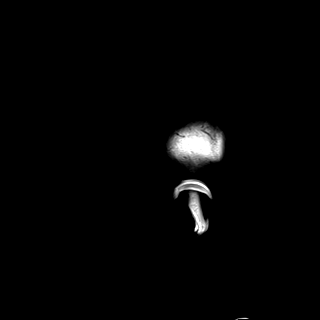
[im 20/39]
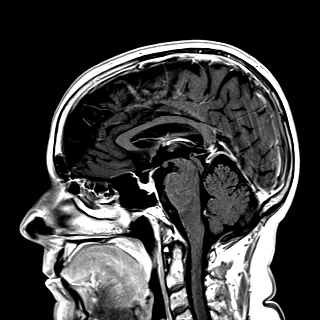
[im 39/39]
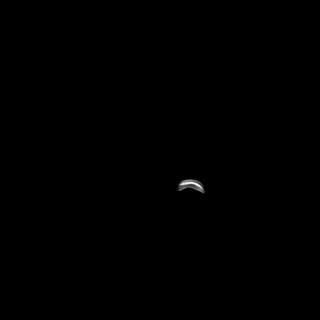

[48 of 48 positions shown; findings below may reference images not displayed]

FINDINGS: Brain: Right cavernous meningioma with filling of the cavernous
sinus and extension along the anterior clinoid, lateral right sella,
and upper right Meckel's cave. Unchanged lateral displacement of the
encased right oculomotor nerve. Unchanged mild mass effect on the
pituitary gland. The irregular shape limits reproducible
measurement, but a rough estimate of size is 20 x 14 x 20 mm-stable.
Unchanged covering of the right superior orbital fissure. No mass
effect on the chiasm.

Few scattered FLAIR hyperintensities in the cerebral white matter,
possibly related patient's history of chronic hypertension. No
incidental infarct, hemorrhage, hydrocephalus, or collection. Brain
volume is normal

Vascular: Encased right cavernous ICA without evident narrowing

Skull and upper cervical spine: Negative

Sinuses/Orbits: As above
IMPRESSION: Size stable right cavernous meningioma as described.

## 2022-07-14 ENCOUNTER — Other Ambulatory Visit: Payer: Self-pay | Admitting: Family Medicine

## 2022-07-14 DIAGNOSIS — Z1231 Encounter for screening mammogram for malignant neoplasm of breast: Secondary | ICD-10-CM

## 2022-09-13 ENCOUNTER — Ambulatory Visit
Admission: RE | Admit: 2022-09-13 | Discharge: 2022-09-13 | Disposition: A | Discharge status: Home / Self Care | Payer: BLUE CROSS/BLUE SHIELD | Source: Ambulatory Visit | Attending: Family Medicine | Admitting: Family Medicine

## 2022-09-13 DIAGNOSIS — Z1231 Encounter for screening mammogram for malignant neoplasm of breast: Secondary | ICD-10-CM

## 2023-06-26 ENCOUNTER — Other Ambulatory Visit: Payer: Self-pay | Admitting: Family Medicine

## 2023-06-26 DIAGNOSIS — Z1231 Encounter for screening mammogram for malignant neoplasm of breast: Secondary | ICD-10-CM

## 2023-06-26 DIAGNOSIS — Z13820 Encounter for screening for osteoporosis: Secondary | ICD-10-CM

## 2023-09-15 ENCOUNTER — Ambulatory Visit
Admission: RE | Admit: 2023-09-15 | Discharge: 2023-09-15 | Disposition: A | Discharge status: Home / Self Care | Payer: Medicare Other | Source: Ambulatory Visit | Attending: Family Medicine | Admitting: Family Medicine

## 2023-09-15 DIAGNOSIS — Z1231 Encounter for screening mammogram for malignant neoplasm of breast: Secondary | ICD-10-CM

## 2024-01-22 ENCOUNTER — Ambulatory Visit
Admission: RE | Admit: 2024-01-22 | Discharge: 2024-01-22 | Disposition: A | Discharge status: Home / Self Care | Payer: BC Managed Care – PPO | Source: Ambulatory Visit | Attending: Family Medicine | Admitting: Family Medicine

## 2024-01-22 DIAGNOSIS — Z13820 Encounter for screening for osteoporosis: Secondary | ICD-10-CM

## 2024-07-17 ENCOUNTER — Other Ambulatory Visit: Payer: Self-pay | Admitting: Orthopedic Surgery

## 2024-07-29 ENCOUNTER — Encounter (HOSPITAL_BASED_OUTPATIENT_CLINIC_OR_DEPARTMENT_OTHER): Payer: Self-pay | Admitting: Orthopedic Surgery

## 2024-07-29 ENCOUNTER — Other Ambulatory Visit: Payer: Self-pay

## 2024-07-30 ENCOUNTER — Encounter (HOSPITAL_BASED_OUTPATIENT_CLINIC_OR_DEPARTMENT_OTHER)
Admission: RE | Admit: 2024-07-30 | Discharge: 2024-07-30 | Disposition: A | Discharge status: Home / Self Care | Source: Ambulatory Visit | Attending: Orthopedic Surgery | Admitting: Orthopedic Surgery

## 2024-07-30 DIAGNOSIS — I444 Left anterior fascicular block: Secondary | ICD-10-CM | POA: Diagnosis not present

## 2024-07-30 DIAGNOSIS — Z01812 Encounter for preprocedural laboratory examination: Secondary | ICD-10-CM | POA: Diagnosis present

## 2024-07-30 DIAGNOSIS — Z01818 Encounter for other preprocedural examination: Secondary | ICD-10-CM | POA: Insufficient documentation

## 2024-07-30 DIAGNOSIS — I10 Essential (primary) hypertension: Secondary | ICD-10-CM | POA: Insufficient documentation

## 2024-07-30 DIAGNOSIS — Z01810 Encounter for preprocedural cardiovascular examination: Secondary | ICD-10-CM | POA: Diagnosis present

## 2024-07-30 LAB — BASIC METABOLIC PANEL WITH GFR
Anion gap: 16 — ABNORMAL HIGH (ref 5–15)
BUN: 11 mg/dL (ref 8–23)
CO2: 22 mmol/L (ref 22–32)
Calcium: 9.5 mg/dL (ref 8.9–10.3)
Chloride: 93 mmol/L — ABNORMAL LOW (ref 98–111)
Creatinine, Ser: 0.63 mg/dL (ref 0.44–1.00)
GFR, Estimated: >60 mL/min (ref >60)
Glucose, Bld: 96 mg/dL (ref 70–99)
Potassium: 3.7 mmol/L (ref 3.5–5.1)
Sodium: 131 mmol/L — ABNORMAL LOW (ref 135–145)

## 2024-08-06 ENCOUNTER — Encounter (HOSPITAL_BASED_OUTPATIENT_CLINIC_OR_DEPARTMENT_OTHER)
Admission: RE | Disposition: A | Discharge status: Home / Self Care | Payer: Self-pay | Source: Home / Self Care | Attending: Orthopedic Surgery

## 2024-08-06 ENCOUNTER — Ambulatory Visit (HOSPITAL_BASED_OUTPATIENT_CLINIC_OR_DEPARTMENT_OTHER)
Admission: RE | Admit: 2024-08-06 | Discharge: 2024-08-06 | Disposition: A | Discharge status: Home / Self Care | Attending: Orthopedic Surgery | Admitting: Orthopedic Surgery

## 2024-08-06 ENCOUNTER — Encounter (HOSPITAL_BASED_OUTPATIENT_CLINIC_OR_DEPARTMENT_OTHER): Payer: Self-pay | Admitting: Orthopedic Surgery

## 2024-08-06 ENCOUNTER — Ambulatory Visit (HOSPITAL_BASED_OUTPATIENT_CLINIC_OR_DEPARTMENT_OTHER): Admitting: Anesthesiology

## 2024-08-06 ENCOUNTER — Other Ambulatory Visit: Payer: Self-pay

## 2024-08-06 DIAGNOSIS — Z86011 Personal history of benign neoplasm of the brain: Secondary | ICD-10-CM | POA: Diagnosis not present

## 2024-08-06 DIAGNOSIS — K219 Gastro-esophageal reflux disease without esophagitis: Secondary | ICD-10-CM | POA: Diagnosis not present

## 2024-08-06 DIAGNOSIS — E785 Hyperlipidemia, unspecified: Secondary | ICD-10-CM | POA: Diagnosis not present

## 2024-08-06 DIAGNOSIS — I10 Essential (primary) hypertension: Secondary | ICD-10-CM | POA: Diagnosis not present

## 2024-08-06 DIAGNOSIS — G5601 Carpal tunnel syndrome, right upper limb: Secondary | ICD-10-CM | POA: Insufficient documentation

## 2024-08-06 HISTORY — DX: Gastro-esophageal reflux disease without esophagitis: K21.9

## 2024-08-06 HISTORY — DX: Hyperlipidemia, unspecified: E78.5

## 2024-08-06 HISTORY — PX: CARPAL TUNNEL RELEASE: SHX101

## 2024-08-06 SURGERY — CARPAL TUNNEL RELEASE
Anesthesia: Monitor Anesthesia Care | Site: Wrist | Laterality: Right

## 2024-08-06 MED ORDER — LACTATED RINGERS IV SOLN: INTRAVENOUS | Status: DC

## 2024-08-06 MED ORDER — 0.9 % SODIUM CHLORIDE (POUR BTL) OPTIME
TOPICAL | Status: DC | PRN
  Administered 2024-08-06: 1000 mL

## 2024-08-06 MED ORDER — CEFAZOLIN SODIUM-DEXTROSE 2-4 GM/100ML-% IV SOLN
INTRAVENOUS | Status: AC
  Filled 2024-08-06: qty 100

## 2024-08-06 MED ORDER — HYDROCODONE-ACETAMINOPHEN 5-325 MG PO TABS: 1.0000 | ORAL_TABLET | Freq: Four times a day (QID) | ORAL | 0 refills | Status: AC | PRN

## 2024-08-06 MED ORDER — CEFAZOLIN SODIUM-DEXTROSE 2-4 GM/100ML-% IV SOLN
2.0000 g | INTRAVENOUS | Status: AC
  Administered 2024-08-06: 2 g via INTRAVENOUS

## 2024-08-06 MED ORDER — FENTANYL CITRATE (PF) 100 MCG/2ML IJ SOLN: 25.0000 ug | INTRAMUSCULAR | Status: DC | PRN

## 2024-08-06 MED ORDER — FENTANYL CITRATE (PF) 100 MCG/2ML IJ SOLN
INTRAMUSCULAR | Status: DC | PRN
  Administered 2024-08-06: 50 ug via INTRAVENOUS

## 2024-08-06 MED ORDER — ONDANSETRON HCL 4 MG/2ML IJ SOLN
INTRAMUSCULAR | Status: AC
Start: 2024-08-06 — End: 2024-08-06
  Filled 2024-08-06: qty 2

## 2024-08-06 MED ORDER — ONDANSETRON HCL 4 MG/2ML IJ SOLN: 4.0000 mg | Freq: Once | INTRAMUSCULAR | Status: DC | PRN

## 2024-08-06 MED ORDER — BUPIVACAINE HCL (PF) 0.25 % IJ SOLN
INTRAMUSCULAR | Status: DC | PRN
Start: 2024-08-06 — End: 2024-08-06
  Administered 2024-08-06: 9 mL

## 2024-08-06 MED ORDER — MIDAZOLAM HCL 2 MG/2ML IJ SOLN
INTRAMUSCULAR | Status: AC
Start: 2024-08-06 — End: 2024-08-06
  Filled 2024-08-06: qty 2

## 2024-08-06 MED ORDER — ONDANSETRON HCL 4 MG/2ML IJ SOLN
INTRAMUSCULAR | Status: DC | PRN
  Administered 2024-08-06: 4 mg via INTRAVENOUS

## 2024-08-06 MED ORDER — LIDOCAINE HCL (PF) 0.5 % IJ SOLN
INTRAMUSCULAR | Status: DC | PRN
  Administered 2024-08-06: 30 mL via INTRAVENOUS

## 2024-08-06 MED ORDER — PROPOFOL 10 MG/ML IV BOLUS
INTRAVENOUS | Status: DC | PRN
  Administered 2024-08-06 (×2): 20 mg via INTRAVENOUS

## 2024-08-06 MED ORDER — OXYCODONE HCL 5 MG PO TABS: 5.0000 mg | ORAL_TABLET | Freq: Once | ORAL | Status: DC | PRN

## 2024-08-06 MED ORDER — FENTANYL CITRATE (PF) 100 MCG/2ML IJ SOLN
INTRAMUSCULAR | Status: AC
  Filled 2024-08-06: qty 2

## 2024-08-06 MED ORDER — MIDAZOLAM HCL 5 MG/5ML IJ SOLN
INTRAMUSCULAR | Status: DC | PRN
  Administered 2024-08-06: 2 mg via INTRAVENOUS

## 2024-08-06 MED ORDER — OXYCODONE HCL 5 MG/5ML PO SOLN: 5.0000 mg | Freq: Once | ORAL | Status: DC | PRN

## 2024-08-06 MED ORDER — PROPOFOL 500 MG/50ML IV EMUL
INTRAVENOUS | Status: DC | PRN
  Administered 2024-08-06: 50 ug/kg/min via INTRAVENOUS

## 2024-08-06 SURGICAL SUPPLY — 31 items
BLADE SURG 15 STRL LF DISP TIS (BLADE) ×2 IMPLANT
BNDG COMPR ESMARK 4X3 LF (GAUZE/BANDAGES/DRESSINGS) IMPLANT
BNDG ELASTIC 3INX 5YD STR LF (GAUZE/BANDAGES/DRESSINGS) ×1 IMPLANT
BNDG GAUZE DERMACEA FLUFF 4 (GAUZE/BANDAGES/DRESSINGS) ×1 IMPLANT
CHLORAPREP W/TINT 26 (MISCELLANEOUS) ×1 IMPLANT
CORD BIPOLAR FORCEPS 12FT (ELECTRODE) ×1 IMPLANT
COVER BACK TABLE 60X90IN (DRAPES) ×1 IMPLANT
COVER MAYO STAND STRL (DRAPES) ×1 IMPLANT
CUFF TOURN SGL QUICK 18X4 (TOURNIQUET CUFF) ×1 IMPLANT
DRAPE EXTREMITY T 121X128X90 (DISPOSABLE) ×1 IMPLANT
DRAPE SURG 17X23 STRL (DRAPES) ×1 IMPLANT
GAUZE PAD ABD 8X10 STRL (GAUZE/BANDAGES/DRESSINGS) ×1 IMPLANT
GAUZE SPONGE 4X4 12PLY STRL (GAUZE/BANDAGES/DRESSINGS) ×1 IMPLANT
GAUZE XEROFORM 1X8 LF (GAUZE/BANDAGES/DRESSINGS) ×1 IMPLANT
GLOVE BIO SURGEON STRL SZ7.5 (GLOVE) ×1 IMPLANT
GLOVE BIOGEL PI IND STRL 6.5 (GLOVE) IMPLANT
GLOVE BIOGEL PI IND STRL 7.0 (GLOVE) IMPLANT
GLOVE BIOGEL PI IND STRL 8 (GLOVE) ×1 IMPLANT
GLOVE SURG SS PI 6.5 STRL IVOR (GLOVE) IMPLANT
GOWN STRL REUS W/ TWL LRG LVL3 (GOWN DISPOSABLE) ×1 IMPLANT
GOWN STRL REUS W/TWL XL LVL3 (GOWN DISPOSABLE) ×1 IMPLANT
NDL HYPO 25X1 1.5 SAFETY (NEEDLE) ×1 IMPLANT
PACK BASIN DAY SURGERY FS (CUSTOM PROCEDURE TRAY) ×1 IMPLANT
PADDING CAST ABS COTTON 4X4 ST (CAST SUPPLIES) ×1 IMPLANT
SOLN 0.9% NACL POUR BTL 1000ML (IV SOLUTION) ×1 IMPLANT
STOCKINETTE 4X48 STRL (DRAPES) ×1 IMPLANT
SUT ETHILON 4 0 PS 2 18 (SUTURE) ×1 IMPLANT
SYR BULB EAR ULCER 3OZ GRN STR (SYRINGE) ×1 IMPLANT
SYR CONTROL 10ML LL (SYRINGE) ×1 IMPLANT
TOWEL GREEN STERILE FF (TOWEL DISPOSABLE) ×2 IMPLANT
UNDERPAD 30X36 HEAVY ABSORB (UNDERPADS AND DIAPERS) ×1 IMPLANT

## 2024-08-06 NOTE — Transfer of Care (Signed)
 Immediate Anesthesia Transfer of Care Note  Patient: Stephanie Beard  Procedure(s) Performed: CARPAL TUNNEL RELEASE (Right: Wrist)  Patient Location: PACU  Anesthesia Type:MAC and Bier block  Level of Consciousness: awake, alert , and oriented  Airway & Oxygen Therapy: Patient Spontanous Breathing  Post-op Assessment: Report given to RN and Post -op Vital signs reviewed and stable  Post vital signs: Reviewed and stable  Last Vitals:  Vitals Value Taken Time  BP    Temp    Pulse    Resp    SpO2      Last Pain:  Vitals:   08/06/24 1137  TempSrc: Temporal  PainSc: 0-No pain         Complications: No notable events documented.

## 2024-08-06 NOTE — H&P (Signed)
 Stephanie Beard is an 68 y.o. female.   Chief Complaint: carpal tunnel syndrome HPI: 68 y.o. yo female with numbness and tingling right hand.  Nocturnal symptoms. Positive nerve conduction studies. She wishes to have right carpal tunnel release.   Allergies: No Known Allergies  Past Medical History:  Diagnosis Date   Arthritis    Right knee osteoarthritis   GERD (gastroesophageal reflux disease)    Hyperlipidemia    Hypertension 09/12/2010   Meningioma ([facility]) 04/12/2013   brain MRI(hx. double vision and treatment with radiation-last tx. radiation 8'14(no further problems)    Past Surgical History:  Procedure Laterality Date   CESAREAN SECTION  1990   KNEE ARTHROSCOPY Right 08/22/2013   Procedure: RIGHT KNEE ARTHROSCOPY WITH PARTIAL MEDIAL and LATERAL MENISECTOMY/DEBRIDEMENT;  Surgeon: Reyes JAYSON Billing, MD;  Location: [facility];  Service: Orthopedics;  Laterality: Right;   OTHER SURGICAL HISTORY     TUBAL LIGATION Bilateral 1992   VAGINAL HYSTERECTOMY  1993   secondary prolapse w/A&P repair   WISDOM TOOTH EXTRACTION  1978    Family History: Family History  Problem Relation Age of Onset   Hypertension Mother    Hypercholesterolemia Mother    Heart disease Mother    Heart disease Father    Hypertension Father    Hypertension Brother    Hyperlipidemia Brother    Breast cancer Neg Hx     Social History:   reports that she has never smoked. She has never used smokeless tobacco. She reports current alcohol use of about 7.0 standard drinks of alcohol per week. She reports that she does not use drugs.  Medications: Medications Prior to Admission  Medication Sig Dispense Refill   Ascorbic Acid (VITAMIN C) 1000 MG tablet Take 1,000 mg by mouth daily.     atorvastatin  (LIPITOR) 80 MG tablet Take 1 tablet (80 mg total) by mouth daily. 90 tablet 3   calcium -vitamin D  250-100 MG-UNIT per tablet Take 1 tablet by mouth daily.      Cyanocobalamin (VITAMIN B-12 PO) Take by mouth daily.      hydrochlorothiazide (HYDRODIURIL) 25 MG tablet Take 25 mg by mouth every morning.      Multiple Vitamin (MULTIVITAMIN WITH MINERALS) TABS tablet Take 1 tablet by mouth daily.     potassium chloride  SA (K-DUR,KLOR-CON ) 20 MEQ tablet Take 1 tablet (20 mEq total) by mouth daily. 90 tablet 3    No results found for this or any previous visit (from the past 48 hours).  No results found.    Blood pressure (!) 150/83, pulse (!) 58, temperature 98.7 F (37.1 C), temperature source Temporal, resp. rate 18, height 5' 1 (1.549 m), weight 65.9 kg, last menstrual period 01/11/1992, SpO2 100%.  General appearance: alert, cooperative, and appears stated age Head: Normocephalic, without obvious abnormality, atraumatic Neck: supple, symmetrical, trachea midline Extremities: Intact capillary refill all digits.  +epl/fpl/io.  No wounds.  Skin: Skin color, texture, turgor normal. No rashes or lesions Neurologic: Grossly normal Incision/Wound: none  Assessment/Plan Right carpal tunnel syndrome.  Non operative and operative treatment options have been discussed with the patient and patient wishes to proceed with operative treatment. Risks, benefits, and alternatives of surgery have been discussed and the patient agrees with the plan of care.   Hawley Michel 08/06/2024, 12:36 PM

## 2024-08-06 NOTE — Anesthesia Postprocedure Evaluation (Signed)
 Anesthesia Post Note  Patient: Stephanie Beard  Procedure(s) Performed: CARPAL TUNNEL RELEASE (Right: Wrist)     Patient location during evaluation: PACU Anesthesia Type: MAC and Bier Block Level of consciousness: awake and alert and oriented Pain management: pain level controlled Vital Signs Assessment: post-procedure vital signs reviewed and stable Respiratory status: spontaneous breathing, nonlabored ventilation and respiratory function stable Cardiovascular status: stable and blood pressure returned to baseline Postop Assessment: no apparent nausea or vomiting Anesthetic complications: no   No notable events documented.  Last Vitals:  Vitals:   08/06/24 1330 08/06/24 1345  BP: (!) 165/80 (!) 165/88  Pulse: (!) 57   Resp:    Temp: (!) 36 C   SpO2: 95% 95%    Last Pain:  Vitals:   08/06/24 1345  TempSrc:   PainSc: 0-No pain                 Andrey Mccaskill A.

## 2024-08-06 NOTE — Anesthesia Preprocedure Evaluation (Addendum)
 Anesthesia Evaluation  Patient identified by MRN, date of birth, ID band Patient awake    Reviewed: Allergy & Precautions, NPO status , Patient's Chart, lab work & pertinent test results  Airway Mallampati: II  TM Distance: >3 FB     Dental no notable dental hx. (+) Dental Advisory Given, Teeth Intact, Caps   Pulmonary neg pulmonary ROS   Pulmonary exam normal breath sounds clear to auscultation       Cardiovascular hypertension, Pt. on medications Normal cardiovascular exam Rhythm:Regular Rate:Normal  BPPV   Neuro/Psych Hx/o diplopia 2014 meningioma Rx'd with RT- resolved  Right Carpal Tunnel syndrome  Neuromuscular disease  negative psych ROS   GI/Hepatic Neg liver ROS,GERD  ,,  Endo/Other  HLD  Renal/GU Lab Results      Component                Value               Date                      NA                       131 (L)             07/30/2024                CL                       93 (L)              07/30/2024                K                        3.7                 07/30/2024                CO2                      22                  07/30/2024                BUN                      11                  07/30/2024                CREATININE               0.63                07/30/2024                GFRNONAA                 >60                 07/30/2024                CALCIUM                   9.5                 07/30/2024  ALBUMIN                  4.6                 04/02/2019                GLUCOSE                  96                  07/30/2024             negative genitourinary   Musculoskeletal  (+) Arthritis , Osteoarthritis,    Abdominal   Peds  Hematology negative hematology ROS (+) Lab Results      Component                Value               Date                      WBC                      3.5                 07/17/2017                HGB                      14.6                 07/17/2017                HCT                      44.1                07/17/2017                MCV                      101 (H)             07/17/2017                PLT                      260                 07/17/2017              Anesthesia Other Findings   Reproductive/Obstetrics                              Anesthesia Physical Anesthesia Plan  ASA: 2  Anesthesia Plan: Bier Block and MAC and Bier Block-Lidocaine  Only   Post-op Pain Management: Minimal or no pain anticipated   Induction: Intravenous  PONV Risk Score and Plan: 3 and Treatment may vary due to age or medical condition and Propofol  infusion  Airway Management Planned: Natural Airway and Simple Face Mask  Additional Equipment:   Intra-op Plan:   Post-operative Plan:   Informed Consent: I have reviewed the patients History and Physical, chart, labs and discussed the procedure including the risks, benefits and alternatives for the proposed anesthesia with the patient or authorized representative who has indicated his/her understanding and  acceptance.     Dental advisory given  Plan Discussed with: CRNA and Anesthesiologist  Anesthesia Plan Comments:          Anesthesia Quick Evaluation

## 2024-08-06 NOTE — Discharge Instructions (Addendum)

## 2024-08-06 NOTE — Op Note (Signed)
 08/06/2024 [facility]                              OPERATIVE REPORT   PREOPERATIVE DIAGNOSIS:  Right carpal tunnel syndrome  POSTOPERATIVE DIAGNOSIS:  Right carpal tunnel syndrome  PROCEDURE:  Right carpal tunnel release  SURGEON:  Franky Curia, MD  ASSISTANT:  none.  ANESTHESIA: Bier block with sedation  IV FLUIDS:  Per anesthesia flow sheet  ESTIMATED BLOOD LOSS:  Minimal  COMPLICATIONS:  None  SPECIMENS:  None  TOURNIQUET TIME:    Total Tourniquet Time Documented: Forearm (Right) - 24 minutes Total: Forearm (Right) - 24 minutes   DISPOSITION:  Stable to PACU  LOCATION: [facility]  INDICATIONS:  68 y.o. yo female with numbness and tingling right hand.  Nocturnal symptoms. Positive nerve conduction studies. She wishes to proceed with right carpal tunnel release.  Risks, benefits and alternatives of surgery were discussed including the risk of blood loss; infection; damage to nerves, vessels, tendons, ligaments, bone; failure of surgery; need for additional surgery; complications with wound healing; continued pain; recurrence of carpal tunnel syndrome; and damage to motor branch. She voiced understanding of these risks and elected to proceed.   OPERATIVE COURSE:  After being identified preoperatively by myself, the patient and I agreed upon the procedure and site of procedure.  The surgical site was marked.  Surgical consent had been signed.  She was given IV Ancef  as preoperative antibiotic prophylaxis.  She was transferred to the operating room and placed on the operating room table in supine position with the Right upper extremity on an armboard.  Bier block anesthesia was induced by the anesthesiologist.  Right upper extremity was prepped and draped in normal sterile orthopaedic fashion.  A surgical pause was performed between the surgeons, anesthesia, and operating room staff, and all were in agreement as to the patient, procedure, and site of  procedure.  Tourniquet at the proximal aspect of the forearm had been inflated for the Bier block  Incision was made over the transverse carpal ligament and carried into the subcutaneous tissues by spreading technique.  Bipolar electrocautery was used to obtain hemostasis.  The palmar fascia was sharply incised.  The transverse carpal ligament was identified.  The fascia distal to the ligament was opened.  Retractor was placed and the flexor tendons were identified.  The flexor tendon to the ring finger was identified and retracted radially.  The transverse carpal ligament was then incised from distal to proximal under direct visualization.  Scissors were used to split the distal aspect of the volar antebrachial fascia.  A finger was placed into the wound to ensure complete decompression, which was the case.  The nerve was examined.  It was flattened and hyperemic.  The motor branch was identified and was intact.  The wound was copiously irrigated with sterile saline.  It was then closed with 4-0 nylon in a horizontal mattress fashion.  It was injected with 0.25% plain Marcaine  to aid in postoperative analgesia.  It was dressed with sterile Xeroform, 4x4s, an ABD, and wrapped with Kerlix and an Ace bandage.  Tourniquet was deflated at 24 minutes.  Fingertips were pink with brisk capillary refill after deflation of the tourniquet.  Operative drapes were broken down.  The patient was awoken from anesthesia safely.  She was transferred back to stretcher and taken to the PACU in stable condition.  I will see her back  in the office in 1 week for postoperative followup.  I will give her a prescription for Norco 5/325 1 tab PO q6 hours prn pain, dispense #15.    Neils Siracusa, MD Electronically signed, 08/06/24

## 2024-08-07 ENCOUNTER — Encounter (HOSPITAL_BASED_OUTPATIENT_CLINIC_OR_DEPARTMENT_OTHER): Payer: Self-pay | Admitting: Orthopedic Surgery
